# Patient Record
Sex: Male | Born: 1985 | Race: White | Hispanic: Yes | Marital: Married | Smoking: Never smoker
Health system: Southern US, Community
[De-identification: ages and names within clinical notes are randomized; demographics above are authoritative.]

## PROBLEM LIST (undated history)

## (undated) DIAGNOSIS — E785 Hyperlipidemia, unspecified: Secondary | ICD-10-CM

## (undated) DIAGNOSIS — J342 Deviated nasal septum: Secondary | ICD-10-CM

---

## 2018-06-17 ENCOUNTER — Encounter (INDEPENDENT_AMBULATORY_CARE_PROVIDER_SITE_OTHER): Payer: Self-pay | Admitting: Physician Assistant

## 2018-06-17 ENCOUNTER — Other Ambulatory Visit: Payer: Self-pay

## 2018-06-17 ENCOUNTER — Ambulatory Visit (INDEPENDENT_AMBULATORY_CARE_PROVIDER_SITE_OTHER): Payer: 59 | Admitting: Physician Assistant

## 2018-06-17 VITALS — BP 107/74 | HR 83 | Temp 97.8°F | Ht 71.5 in | Wt 238.8 lb

## 2018-06-17 DIAGNOSIS — R079 Chest pain, unspecified: Secondary | ICD-10-CM

## 2018-06-17 DIAGNOSIS — Z114 Encounter for screening for human immunodeficiency virus [HIV]: Secondary | ICD-10-CM

## 2018-06-17 DIAGNOSIS — F411 Generalized anxiety disorder: Secondary | ICD-10-CM | POA: Diagnosis not present

## 2018-06-17 DIAGNOSIS — R109 Unspecified abdominal pain: Secondary | ICD-10-CM

## 2018-06-17 LAB — POCT URINALYSIS DIPSTICK
BILIRUBIN UA: NEGATIVE
GLUCOSE UA: NEGATIVE
Ketones, UA: NEGATIVE
Leukocytes, UA: NEGATIVE
Nitrite, UA: NEGATIVE
Protein, UA: POSITIVE — AB
RBC UA: NEGATIVE
Spec Grav, UA: 1.015 (ref 1.010–1.025)
Urobilinogen, UA: 0.2 E.U./dL
pH, UA: 8.5 — AB (ref 5.0–8.0)

## 2018-06-17 MED ORDER — NAPROXEN 500 MG PO TABS: 500.0000 mg | ORAL_TABLET | Freq: Two times a day (BID) | ORAL | 0 refills | Status: DC

## 2018-06-17 MED ORDER — DULOXETINE HCL 30 MG PO CPEP: 30.0000 mg | ORAL_CAPSULE | Freq: Every day | ORAL | 3 refills | Status: DC

## 2018-06-17 NOTE — Patient Instructions (Signed)
Dolor de pecho inespecífico  (Nonspecific Chest Pain)  El dolor de pecho puede deberse a muchas enfermedades diferentes. Siempre existe una posibilidad de que el dolor esté relacionado con algo grave, como un infarto de miocardio o un coágulo sanguíneo en los pulmones. Hay muchas enfermedades que no son potencialmente mortales que pueden causar dolor de pecho. Si tiene dolor de pecho, es muy importante que se controle con el médico.  CAUSAS  Las causas del dolor de pecho pueden ser las siguientes:  Acidez estomacal.  Neumonía o bronquitis.  Ansiedad o estrés.  Inflamación de la zona que rodea al corazón (pericarditis) o a los pulmones (pleuritis o pleuresía).  Un coágulo sanguíneo en el pulmón.  Colapso de un pulmón (neumotórax), que puede aparecer de manera repentina por sí solo (neumotórax espontáneo) o debido a un traumatismo en el tórax.  Culebrilla (virus de la varicela zóster).  Infarto de miocardio.  Daño de los huesos, los músculos y los cartílagos que conforman la pared torácica. Esto puede incluir lo siguiente:  Hematomas óseos debido a lesiones.  Distensiones musculares o de los cartílagos por tos frecuente o repetida, o por exceso de trabajo.  Fractura de una o más costillas.  Dolor de cartílago debido a inflamación (costocondritis).  FACTORES DE RIESGO  Los factores de riesgo de tener dolor de pecho pueden incluir lo siguiente:  Actividades que incrementan el riesgo de sufrir traumatismos o lesiones en el tórax.  Infecciones o enfermedades respiratorias que causan tos frecuente.  Enfermedades o excesos en las comidas que pueden causar acidez.  Enfermedades cardíacas o antecedentes familiares de enfermedades cardíacas.  Enfermedades o comportamientos de salud que aumentan el riesgo de tener un coágulo sanguíneo.  Haber tenido varicela (varicela zóster).  SIGNOS Y SÍNTOMAS  El dolor de pecho puede provocar las siguientes sensaciones:  Ardor u hormigueo en la superficie o en lo profundo del pecho.  Dolor  opresivo, continuo o constrictivo.  Dolor vago o intenso que empeora al moverse, toser o inhalar profundamente.  Dolor que también se siente en la espalda, el cuello, el hombro o el brazo, o dolor que se irradia a cualquiera de estas zonas.  El dolor de pecho puede aparecer y desaparecer, o bien puede ser constante.  DIAGNÓSTICO  Quizás se necesiten análisis de laboratorio u otros estudios para encontrar la causa del dolor. El médico puede indicarle que se haga una prueba llamada EGC (electrocadiograma) ambulatorio. El electrocardiograma registra los patrones de los latidos cardíacos en el momento en que se realiza el estudio. También pueden hacerle otros estudios, por ejemplo:  Ecocardiograma transtorácico (ETT). Durante el ecocardiograma, se usan ondas sonoras para crear una imagen de todas las estructuras cardíacas y evaluar cómo circula la sangre por el corazón.  Ecocardiograma transesofágico (ETE). Este es un estudio de diagnóstico por imágenes más avanzado que el obtiene imágenes del interior del cuerpo. Le permite al médico ver el corazón con mayor detalle.  Monitoreo cardíaco. Permite que el médico controle la frecuencia y el ritmo cardíaco en tiempo real.  Monitor Holter. Es un dispositivo portátil que registra los latidos del corazón y puede ayudar a diagnosticar las arritmias cardíacas. Le permite al médico registrar la actividad cardíaca durante varios días, si es necesario.  Pruebas de esfuerzo. Estas pueden realizarse durante el ejercicio o mediante la administración de un medicamento que acelera los latidos del corazón.  Análisis de sangre.  Diagnóstico por imágenes.  TRATAMIENTO  El tratamiento depende de la causa del dolor de pecho. El tratamiento puede incluir   lo siguiente:  Medicamentos. Estos pueden incluir lo siguiente:  Inhibidores de la acidez estomacal.  Antiinflamatorios.  Analgésicos para las enfermedades inflamatorias.  Antibióticos, si hay una infección.  Medicamentos para disolver los  coágulos sanguíneos.  Medicamentos para tratar la enfermedad arterial coronaria.  Tratamiento complementario para las enfermedades que no requieren la toma de medicamentos. Esto puede incluir lo siguiente:  Descansar.  Aplicar compresas frías o calientes en las zonas lesionadas.  Limitar las actividades hasta que disminuya el dolor.  INSTRUCCIONES PARA EL CUIDADO EN EL HOGAR  Si le recetaron antibióticos, asegúrese de terminarlos, incluso si comienza a sentirse mejor.  Evite las actividades que le causen dolor de pecho.  No consuma ningún producto que contenga tabaco, lo que incluye cigarrillos, tabaco de mascar o cigarrillos electrónicos. Si necesita ayuda para dejar de fumar, consulte al médico.  No beba alcohol.  Tome los medicamentos solamente como se lo haya indicado el médico.  Concurra a todas las visitas de control como se lo haya indicado el médico. Esto es importante. Esto incluye otros estudios si el dolor de pecho no desaparece.  Si la acidez es la causa del dolor de pecho, tal vez le aconsejen que mantenga la cabeza levantada (elevada) mientras duerme. Esto reduce la probabilidad de que el ácido retroceda del estómago al esófago.  Haga cambios en su estilo de vida como se lo haya indicado el médico. Estos pueden incluir lo siguiente:  Practicar actividad física con regularidad. Pida al médico que le sugiera algunas actividades que sean seguras para usted.  Consumir una dieta cardiosaludable. Un nutricionista matriculado puede ayudarlo a hacer elecciones saludables.  Mantener un peso saludable.  Controlar la diabetes, si es necesario.  Reducir las situaciones de estrés.    SOLICITE ATENCIÓN MÉDICA SI:  El dolor de pecho no desaparece después del tratamiento.  Tiene una erupción cutánea con ampollas en el pecho.  Tiene fiebre.    SOLICITE ATENCIÓN MÉDICA DE INMEDIATO SI:  El dolor en el pecho es más intenso.  La tos empeora, o expectora sangre.  Siente un dolor abdominal intenso.  Siente debilidad  intensa.  Se desmaya.  Tiene escalofríos.  Tiene una molestia repentina e inexplicable en el pecho.  Tiene molestias repentinas e inexplicables en los brazos, la espalda, el cuello o la mandíbula.  Le falta el aire en cualquier momento.  Comienza a sudar de manera repentina o la piel se le humedece.  Siente náuseas o vomita.  Se siente repentinamente mareado o se desmaya.  Siente que el corazón comienza a latir rápidamente o que se saltea latidos.  Estos síntomas pueden representar un problema grave que constituye una emergencia. No espere hasta que los síntomas desaparezcan. Solicite atención médica de inmediato. Comuníquese con el servicio de emergencias de su localidad (911 en los [place]). No conduzca por sus propios medios hasta el hospital.  Esta información no tiene como fin reemplazar el consejo del médico. Asegúrese de hacerle al médico cualquier pregunta que tenga.  Document Released: 10/08/2005 Document Revised: 10/29/2014 Document Reviewed: 04/16/2016  Elsevier Interactive Patient Education © 2017 Elsevier Inc.

## 2018-06-17 NOTE — Progress Notes (Signed)
Subjective:  Patient ID: Stephen Gay, male    DOB: 29-Jul-1986  Age: 32 y.o. MRN: [id number]  CC: right flank pain and chest pain  HPI Stephen Gay is a 32 y.o. male with no sginficant medical history presents as new pt with complaint of right flank pain since one year in a waxing and waning fashion. Feels this pain 1-2 times per week. Not associated with meal consumption. Denies trauma/injury. Does not endorse stool abnormalities, urinary abnormalities, jaundice, rash, LE edema, or f/c/n/v. Said an outside provider could not find anything wrong with him but did not order labs or imaging.    Left sided chest pain x6 months. Described as a pressure and lasts approximately 5 minutes. Has approximately 3-4 episodes per week. Not radiating. Not associated with activities. Says he can work and not feel pain. Pain is usually felt when he is at rest. Chest pain associated with tremors. Does not endorse diaphoresis, weakness, nausea, palpitations, or visual blurring. Pt considers himself an anxious person. Seems to worry constantly about "everything". PHQ9 5 and GAD7 9 today.  ROS Review of Systems  Constitutional: Negative for chills, fever and malaise/fatigue.  Eyes: Negative for blurred vision.  Respiratory: Negative for shortness of breath.   Cardiovascular: Positive for chest pain. Negative for palpitations.  Gastrointestinal: Negative for abdominal pain and nausea.  Genitourinary: Negative for dysuria and hematuria.  Musculoskeletal: Negative for joint pain and myalgias.       Right flank pain  Skin: Negative for rash.  Neurological: Negative for tingling and headaches.  Psychiatric/Behavioral: Negative for depression. The patient is nervous/anxious. The patient does not have insomnia.     Objective:  BP 107/74 (BP Location: Left Arm, Patient Position: Sitting, Cuff Size: Large)   Pulse 83   Temp 97.8 F (36.6 C) (Oral)   Ht 5' 11.5" (1.816 m)   Wt 238 lb 12.8 oz (108.3 kg)   SpO2 97%    BMI 32.84 kg/m   BP/Weight 06/17/2018  Systolic BP 107  Diastolic BP 74  Wt. (Lbs) 238.8  BMI 32.84      Physical Exam  Constitutional: He is oriented to person, place, and time.  Well developed, well nourished, NAD, polite  HENT:  Head: Normocephalic and atraumatic.  Eyes: No scleral icterus.  Neck: Normal range of motion. Neck supple. No thyromegaly present.  Cardiovascular: Normal rate, regular rhythm and normal heart sounds.  No LE edema bilaterally  Pulmonary/Chest: Effort normal and breath sounds normal.  Abdominal: Soft. Bowel sounds are normal. There is no tenderness.  No hepatomegaly  Musculoskeletal: He exhibits no edema.  Neurological: He is alert and oriented to person, place, and time.  Skin: Skin is warm and dry. No rash noted. No erythema. No pallor.  Psychiatric: He has a normal mood and affect. His behavior is normal. Thought content normal.  Vitals reviewed.    Assessment & Plan:    1. Right flank pain - Urinalysis Dipstick with 1+ protein - CBC with Differential - Comprehensive metabolic panel - US Abdomen Limited RUQ; Future - Begin naproxen (NAPROSYN) 500 MG tablet; Take 1 tablet (500 mg total) by mouth 2 (two) times daily with a meal.  Dispense: 30 tablet; Refill: 0  2. Chest pain, unspecified type - EKG 12-Lead NSR - Begin naproxen (NAPROSYN) 500 MG tablet; Take 1 tablet (500 mg total) by mouth 2 (two) times daily with a meal.  Dispense: 30 tablet; Refill: 0  3. Screening for HIV (human immunodeficiency virus) - HIV antibody  4. GAD (generalized anxiety disorder) - Begin DULoxetine (CYMBALTA) 30 MG capsule; Take 1 capsule (30 mg total) by mouth daily.  Dispense: 30 capsule; Refill: 3   Meds ordered this encounter  Medications  . naproxen (NAPROSYN) 500 MG tablet    Sig: Take 1 tablet (500 mg total) by mouth 2 (two) times daily with a meal.    Dispense:  30 tablet    Refill:  0    Order Specific Question:   Supervising Provider     Answer:   Hoy RegisterNEWLIN, ENOBONG [[id number]]  . DULoxetine (CYMBALTA) 30 MG capsule    Sig: Take 1 capsule (30 mg total) by mouth daily.    Dispense:  30 capsule    Refill:  3    Order Specific Question:   Supervising Provider    Answer:   Hoy RegisterNEWLIN, ENOBONG [[id number]]    Follow-up: Return in about 4 weeks (around 07/15/2018) for f/u chest pain and right flank pain.Stephen Gay.   Stephen Torr David Lorene Samaan PA

## 2018-06-18 ENCOUNTER — Telehealth (INDEPENDENT_AMBULATORY_CARE_PROVIDER_SITE_OTHER): Payer: Self-pay

## 2018-06-18 LAB — CBC WITH DIFFERENTIAL/PLATELET
BASOS ABS: 0 10*3/uL (ref 0.0–0.2)
BASOS: 1 %
EOS (ABSOLUTE): 0.3 10*3/uL (ref 0.0–0.4)
Eos: 3 %
Hematocrit: 44.6 % (ref 37.5–51.0)
Hemoglobin: 14.8 g/dL (ref 13.0–17.7)
IMMATURE GRANS (ABS): 0 10*3/uL (ref 0.0–0.1)
IMMATURE GRANULOCYTES: 0 %
LYMPHS: 26 %
Lymphocytes Absolute: 2 10*3/uL (ref 0.7–3.1)
MCH: 28.8 pg (ref 26.6–33.0)
MCHC: 33.2 g/dL (ref 31.5–35.7)
MCV: 87 fL (ref 79–97)
Monocytes Absolute: 0.6 10*3/uL (ref 0.1–0.9)
Monocytes: 8 %
NEUTROS PCT: 62 %
Neutrophils Absolute: 4.9 10*3/uL (ref 1.4–7.0)
PLATELETS: 227 10*3/uL (ref 150–450)
RBC: 5.13 x10E6/uL (ref 4.14–5.80)
RDW: 13.7 % (ref 12.3–15.4)
WBC: 7.9 10*3/uL (ref 3.4–10.8)

## 2018-06-18 LAB — COMPREHENSIVE METABOLIC PANEL
ALK PHOS: 80 IU/L (ref 39–117)
ALT: 26 IU/L (ref 0–44)
AST: 20 IU/L (ref 0–40)
Albumin/Globulin Ratio: 1.9 (ref 1.2–2.2)
Albumin: 4.4 g/dL (ref 3.5–5.5)
BUN/Creatinine Ratio: 10 (ref 9–20)
BUN: 8 mg/dL (ref 6–20)
Bilirubin Total: 0.5 mg/dL (ref 0.0–1.2)
CALCIUM: 9.4 mg/dL (ref 8.7–10.2)
CO2: 27 mmol/L (ref 20–29)
CREATININE: 0.83 mg/dL (ref 0.76–1.27)
Chloride: 101 mmol/L (ref 96–106)
GFR calc Af Amer: 136 mL/min/{1.73_m2} (ref >59)
GFR, EST NON AFRICAN AMERICAN: 117 mL/min/{1.73_m2} (ref >59)
GLUCOSE: 96 mg/dL (ref 65–99)
Globulin, Total: 2.3 g/dL (ref 1.5–4.5)
POTASSIUM: 3.8 mmol/L (ref 3.5–5.2)
Sodium: 141 mmol/L (ref 134–144)
Total Protein: 6.7 g/dL (ref 6.0–8.5)

## 2018-06-18 LAB — HIV ANTIBODY (ROUTINE TESTING W REFLEX): HIV SCREEN 4TH GENERATION: NONREACTIVE

## 2018-06-18 NOTE — Telephone Encounter (Signed)
Patient returned call and was informed that all labs are normal. Stephen Gay S Kween Bacorn, CMA  

## 2018-06-18 NOTE — Telephone Encounter (Signed)
Called patient, voicemail not setup yet. Will attempt to reach patient once more before mailing results. Maryjean Mornempestt S Roberts, CMA

## 2018-06-18 NOTE — Telephone Encounter (Signed)
-----   Message from Roger David Gomez, PA-C sent at 06/18/2018 12:58 PM EDT ----- All labs completely normal. 

## 2018-06-18 NOTE — Telephone Encounter (Signed)
-----   Message from Loletta Specteroger David Gomez, PA-C sent at 06/18/2018 12:58 PM EDT ----- All labs completely normal.

## 2018-06-19 ENCOUNTER — Other Ambulatory Visit (INDEPENDENT_AMBULATORY_CARE_PROVIDER_SITE_OTHER): Payer: Self-pay | Admitting: Physician Assistant

## 2018-06-19 ENCOUNTER — Ambulatory Visit (HOSPITAL_COMMUNITY)
Admission: RE | Admit: 2018-06-19 | Discharge: 2018-06-19 | Disposition: A | Discharge status: Home / Self Care | Payer: 59 | Source: Ambulatory Visit | Attending: Physician Assistant | Admitting: Physician Assistant

## 2018-06-19 DIAGNOSIS — R079 Chest pain, unspecified: Secondary | ICD-10-CM

## 2018-06-19 DIAGNOSIS — R109 Unspecified abdominal pain: Secondary | ICD-10-CM

## 2018-06-19 DIAGNOSIS — K8020 Calculus of gallbladder without cholecystitis without obstruction: Secondary | ICD-10-CM | POA: Insufficient documentation

## 2018-06-20 ENCOUNTER — Telehealth (INDEPENDENT_AMBULATORY_CARE_PROVIDER_SITE_OTHER): Payer: Self-pay

## 2018-06-20 NOTE — Telephone Encounter (Signed)
Call placed to patient but voicemail is not setup yet. Will attempt to contact patient once more before mailing results. Stephen Gay S Eddi Hymes, CMA

## 2018-06-20 NOTE — Telephone Encounter (Signed)
-----   Message from Roger David Gomez, PA-C sent at 06/19/2018  5:32 PM EDT ----- Gallstones seen but no gallbladder inflammation. Pt will have to abstain from eating fatty foods. Surgery for removal of the gallbladder is elective at this point. 

## 2018-06-20 NOTE — Telephone Encounter (Signed)
-----   Message from Loletta Specteroger David Gomez, PA-C sent at 06/19/2018  5:32 PM EDT ----- Gallstones seen but no gallbladder inflammation. Pt will have to abstain from eating fatty foods. Surgery for removal of the gallbladder is elective at this point.

## 2018-06-20 NOTE — Telephone Encounter (Signed)
Patient is aware that US revealed gallstones but no inflammation of the gallbladder surgery to remove gallbladder is elective( not required) at this point. Advised to abstain from eating fatty foods. Patient expressed understanding. Maryjean Mornempestt S Mark Benecke, CMA

## 2018-07-21 ENCOUNTER — Ambulatory Visit (INDEPENDENT_AMBULATORY_CARE_PROVIDER_SITE_OTHER): Payer: 59 | Admitting: Physician Assistant

## 2018-07-21 ENCOUNTER — Encounter (INDEPENDENT_AMBULATORY_CARE_PROVIDER_SITE_OTHER): Payer: Self-pay | Admitting: Physician Assistant

## 2018-07-21 VITALS — BP 101/68 | HR 74 | Temp 98.0°F | Ht 71.5 in | Wt 237.6 lb

## 2018-07-21 DIAGNOSIS — R0602 Shortness of breath: Secondary | ICD-10-CM

## 2018-07-21 DIAGNOSIS — R079 Chest pain, unspecified: Secondary | ICD-10-CM

## 2018-07-21 DIAGNOSIS — F411 Generalized anxiety disorder: Secondary | ICD-10-CM

## 2018-07-21 DIAGNOSIS — K8020 Calculus of gallbladder without cholecystitis without obstruction: Secondary | ICD-10-CM | POA: Diagnosis not present

## 2018-07-21 MED ORDER — DULOXETINE HCL 30 MG PO CPEP: 30.0000 mg | ORAL_CAPSULE | Freq: Every day | ORAL | 3 refills | Status: DC

## 2018-07-21 MED ORDER — OMEPRAZOLE 40 MG PO CPDR: 40.0000 mg | DELAYED_RELEASE_CAPSULE | Freq: Two times a day (BID) | ORAL | 0 refills | Status: DC

## 2018-07-21 NOTE — Progress Notes (Signed)
Subjective:  Patient ID: Stephen Gay, male    DOB: November 29, 1985  Age: 32 y.o. MRN: [id number]  CC: f/u chest pain and flank pain  HPI Stephen Gay is a 32 y.o. male with no sginficant medical history presents to f/u on right flank pain. Onset one year in a waxing and waning fashion. Pt had recent US RUQ that revealed cholelithiasis without cholecystitis. Pt states episodes of RUQ pain when eating food such as chicken wings. Also states he has left sided chest pain. Previous work up one month ago revealed normal EKG, CMP, and CBC. Naproxen did not relieve chest pain. Pt also concerned about have respiratory disease and wants a referral to pulmonologist. Says he works in an [employer] and is exposed to chemicals. Wears a respirator at work but thinks he is now becoming more easily short of breath with normal daily tasks. Does not endorse cough or f/c/n/v. Pt did not take Cymbalta because it was not dispensed to him.       Outpatient Medications Prior to Visit  Medication Sig Dispense Refill  . naproxen (NAPROSYN) 500 MG tablet Take 1 tablet (500 mg total) by mouth 2 (two) times daily with a meal. 30 tablet 0  . DULoxetine (CYMBALTA) 30 MG capsule Take 1 capsule (30 mg total) by mouth daily. (Patient not taking: Reported on 07/21/2018) 30 capsule 3   No facility-administered medications prior to visit.      ROS Review of Systems  Constitutional: Negative for chills, fever and malaise/fatigue.  Eyes: Negative for blurred vision.  Respiratory: Positive for shortness of breath. Negative for cough.   Cardiovascular: Positive for chest pain. Negative for palpitations.  Gastrointestinal: Negative for abdominal pain and nausea.  Genitourinary: Negative for dysuria and hematuria.  Musculoskeletal: Negative for joint pain and myalgias.  Skin: Negative for rash.  Neurological: Negative for tingling and headaches.  Psychiatric/Behavioral: Negative for depression. The patient is not  nervous/anxious.     Objective:  BP 101/68   Pulse 74   Temp 98 F (36.7 C) (Oral)   Ht 5' 11.5" (1.816 m)   Wt 237 lb 9.6 oz (107.8 kg)   SpO2 98%   BMI 32.68 kg/m   BP/Weight 07/21/2018 06/17/2018  Systolic BP 101 107  Diastolic BP 68 74  Wt. (Lbs) 237.6 238.8  BMI 32.68 32.84      Physical Exam  Constitutional: He is oriented to person, place, and time.  Well developed, well nourished, NAD, polite  HENT:  Head: Normocephalic and atraumatic.  Eyes: No scleral icterus.  Neck: Normal range of motion. Neck supple. No thyromegaly present.  Cardiovascular: Normal rate, regular rhythm and normal heart sounds.  Pulmonary/Chest: Effort normal and breath sounds normal.  Musculoskeletal: He exhibits no edema.  Neurological: He is alert and oriented to person, place, and time.  Skin: Skin is warm and dry. No rash noted. No erythema. No pallor.  Psychiatric: He has a normal mood and affect. His behavior is normal. Thought content normal.  Vitals reviewed.    Assessment & Plan:   1. Chest pain, unspecified type - Lipid panel - H. pylori antibody, IgG  2. Cholelithiasis without cholecystitis - Pt advised about diet and elective surgery.   3. GAD (generalized anxiety disorder) - DULoxetine (CYMBALTA) 30 MG capsule; Take 1 capsule (30 mg total) by mouth daily.  Dispense: 30 capsule; Refill: 3  4. Shortness of breath - Ambulatory referral to Pulmonology   Meds ordered this encounter  Medications  . omeprazole (  PRILOSEC) 40 MG capsule    Sig: Take 1 capsule (40 mg total) by mouth 2 (two) times daily.    Dispense:  60 capsule    Refill:  0    Order Specific Question:   Supervising Provider    Answer:   Hoy Register [[id number]]  . DULoxetine (CYMBALTA) 30 MG capsule    Sig: Take 1 capsule (30 mg total) by mouth daily.    Dispense:  30 capsule    Refill:  3    Order Specific Question:   Supervising Provider    Answer:   Hoy Register [[id number]]    Follow-up: Return in  about 6 weeks (around 09/01/2018) for Chest pain.   Loletta Specter PA

## 2018-07-21 NOTE — Patient Instructions (Signed)
Acidez estomacal  (Heartburn)  La acidez estomacal es un tipo de dolor o de molestia que se puede presentar en la garganta o en el pecho. A menudo se la describe como una quemazn. Tambin puede producir mal aliento. La sensacin de acidez puede empeorar al acostarse o inclinarse. Puede deberse al retroceso (reflujo) de los contenidos estomacales hacia el tubo que conecta la boca con el estmago (esfago).  CUIDADOS EN EL HOGAR  Tome estas medidas para aliviar las molestias y evitar los problemas.  Dieta   Siga la dieta como se lo haya indicado el mdico. Tal vez deba evitar los siguientes alimentos y bebidas:  ? Caf y t (con o sin cafena).  ? Bebidas que contengan alcohol.  ? Bebidas energizantes y deportivas.  ? Gaseosas o refrescos.  ? Chocolate y cacao.  ? Menta y esencias de menta.  ? Ajo y cebollas.  ? Rbano picante.  ? Alimentos muy condimentados y cidos, como pimientos, chile en polvo, curry en polvo, vinagre, salsas picantes y salsa barbacoa.  ? Frutas ctricas y sus jugos, como naranjas, limones y limas.  ? Alimentos a base de tomates, como salsa roja, chile, salsa y pizza con salsa roja.  ? Alimentos fritos y grasos, como rosquillas, papas fritas y aderezos con alto contenido de grasa.  ? Carnes con alto contenido de grasa, como hot dogs, filetes de entrecot, salchicha, jamn y tocino.  ? Productos lcteos con alto contenido de grasa, como leche entera, mantequilla y queso crema.   Consuma pequeas porciones de comida con ms frecuencia. Evite consumir porciones abundantes.   Evite beber mucho lquido con las comidas.   No coma durante las 2 o 3horas previas a la hora de acostarse.   No se acueste inmediatamente despus de comer.   No haga actividad fsica enseguida despus de comer.  Instrucciones generales   Est atento a cualquier cambio en los sntomas.   Tome los medicamentos de venta libre y los recetados solamente como se lo haya indicado el mdico. No tome aspirina, ibuprofeno ni  otros antiinflamatorios no esteroides (AINE), a menos que el mdico lo autorice.   No consuma ningn producto que contenga tabaco, lo que incluye cigarrillos, tabaco de mascar y cigarrillos electrnicos. Si necesita ayuda para dejar de fumar, consulte al mdico.   Use ropa suelta. No use nada ajustado alrededor de la cintura.   Levante (eleve) unas 6pulgadas (15centmetros) la cabecera de la cama.   Intente bajar el nivel de estrs. Si necesita ayuda para hacerlo, consulte al mdico.   Si tiene sobrepeso, adelgace hasta alcanzar un peso saludable. Pregntele a su mdico cmo puede perder peso de manera segura.   Concurra a todas las visitas de control como se lo haya indicado el mdico. Esto es importante.  SOLICITE AYUDA SI:   Aparecen nuevos sntomas.   Baja de peso y no sabe por qu.   Tiene dificultad para tragar o siente dolor al hacerlo.   Tiene sibilancias o tos que no desaparece.   Los sntomas no mejoran con el tratamiento.   Tiene acidez frecuentemente durante ms de dos semanas.  SOLICITE AYUDA DE INMEDIATO SI:   Tiene dolor en los brazos, el cuello, los maxilares, la dentadura o la espalda.   Transpira, se marea o tiene sensacin de desvanecimiento.   Siente falta de aire o dolor en el pecho.   Vomita y el vmito es parecido a la sangre o a los granos de caf.   Las heces son sanguinolentas   o de color negro.  Esta informacin no tiene como fin reemplazar el consejo del mdico. Asegrese de hacerle al mdico cualquier pregunta que tenga.  Document Released: 06/20/2011 Document Revised: 06/29/2015 Document Reviewed: 02/02/2015  Elsevier Interactive Patient Education  2018 Elsevier Inc.

## 2018-07-22 ENCOUNTER — Other Ambulatory Visit (INDEPENDENT_AMBULATORY_CARE_PROVIDER_SITE_OTHER): Payer: Self-pay | Admitting: Physician Assistant

## 2018-07-22 DIAGNOSIS — A048 Other specified bacterial intestinal infections: Secondary | ICD-10-CM

## 2018-07-22 DIAGNOSIS — E7841 Elevated Lipoprotein(a): Secondary | ICD-10-CM

## 2018-07-22 LAB — LIPID PANEL
Chol/HDL Ratio: 5.3 ratio — ABNORMAL HIGH (ref 0.0–5.0)
Cholesterol, Total: 223 mg/dL — ABNORMAL HIGH (ref 100–199)
HDL: 42 mg/dL (ref >39)
LDL Calculated: 156 mg/dL — ABNORMAL HIGH (ref 0–99)
TRIGLYCERIDES: 125 mg/dL (ref 0–149)
VLDL Cholesterol Cal: 25 mg/dL (ref 5–40)

## 2018-07-22 LAB — H. PYLORI ANTIBODY, IGG: H PYLORI IGG: 8.17 {index_val} — AB (ref 0.00–0.79)

## 2018-07-22 MED ORDER — AMOXICILLIN 500 MG PO TABS: 1000.0000 mg | ORAL_TABLET | Freq: Two times a day (BID) | ORAL | 0 refills | Status: DC

## 2018-07-22 MED ORDER — CLARITHROMYCIN 500 MG PO TABS: 500.0000 mg | ORAL_TABLET | Freq: Two times a day (BID) | ORAL | 0 refills | Status: DC

## 2018-07-25 ENCOUNTER — Telehealth (INDEPENDENT_AMBULATORY_CARE_PROVIDER_SITE_OTHER): Payer: Self-pay

## 2018-07-25 NOTE — Telephone Encounter (Signed)
Call placed using pacific interpreter Lurena Joiner [phone]) patient is aware of H pylori infection and antibiotic being sent to [facility] on [address]. Cholesterol elevated, atorvastatin will be sent once he has completed antibiotics. Patient expressed understanding and did not have any additional questions. Maryjean Morn, CMA

## 2018-07-25 NOTE — Telephone Encounter (Signed)
-----   Message from Loletta Specter, PA-C sent at 07/22/2018  6:10 PM EDT ----- H pylori infection. Cholesterol elevated. I have sent antibiotics to [facility] on [address]. I will send atorvastatin to his pharmacy after he finishes his antibiotics.

## 2018-08-05 ENCOUNTER — Other Ambulatory Visit (INDEPENDENT_AMBULATORY_CARE_PROVIDER_SITE_OTHER): Payer: 59

## 2018-08-05 ENCOUNTER — Encounter: Payer: Self-pay | Admitting: Internal Medicine

## 2018-08-05 ENCOUNTER — Ambulatory Visit (INDEPENDENT_AMBULATORY_CARE_PROVIDER_SITE_OTHER)
Admission: RE | Admit: 2018-08-05 | Discharge: 2018-08-05 | Disposition: A | Discharge status: Home / Self Care | Payer: 59 | Source: Ambulatory Visit | Attending: Internal Medicine | Admitting: Internal Medicine

## 2018-08-05 ENCOUNTER — Ambulatory Visit: Payer: 59 | Admitting: Internal Medicine

## 2018-08-05 VITALS — BP 110/90 | HR 91 | Ht 71.0 in | Wt 232.0 lb

## 2018-08-05 DIAGNOSIS — R0609 Other forms of dyspnea: Secondary | ICD-10-CM

## 2018-08-05 DIAGNOSIS — R0600 Dyspnea, unspecified: Secondary | ICD-10-CM

## 2018-08-05 DIAGNOSIS — R0789 Other chest pain: Secondary | ICD-10-CM

## 2018-08-05 DIAGNOSIS — R0602 Shortness of breath: Secondary | ICD-10-CM | POA: Diagnosis not present

## 2018-08-05 LAB — CBC WITH DIFFERENTIAL/PLATELET
Basophils Absolute: 0 10*3/uL (ref 0.0–0.1)
Basophils Relative: 0.7 % (ref 0.0–3.0)
Eosinophils Absolute: 0.2 10*3/uL (ref 0.0–0.7)
Eosinophils Relative: 4 % (ref 0.0–5.0)
HCT: 45.1 % (ref 39.0–52.0)
Hemoglobin: 15.1 g/dL (ref 13.0–17.0)
LYMPHS ABS: 2.2 10*3/uL (ref 0.7–4.0)
Lymphocytes Relative: 37.5 % (ref 12.0–46.0)
MCHC: 33.4 g/dL (ref 30.0–36.0)
MCV: 85.4 fl (ref 78.0–100.0)
MONOS PCT: 6.9 % (ref 3.0–12.0)
Monocytes Absolute: 0.4 10*3/uL (ref 0.1–1.0)
NEUTROS ABS: 3.1 10*3/uL (ref 1.4–7.7)
NEUTROS PCT: 50.9 % (ref 43.0–77.0)
PLATELETS: 229 10*3/uL (ref 150.0–400.0)
RBC: 5.29 Mil/uL (ref 4.22–5.81)
RDW: 13.1 % (ref 11.5–15.5)
WBC: 6 10*3/uL (ref 4.0–10.5)

## 2018-08-05 LAB — URINALYSIS
Bilirubin Urine: NEGATIVE
Hgb urine dipstick: NEGATIVE
Ketones, ur: NEGATIVE
Leukocytes, UA: NEGATIVE
Nitrite: NEGATIVE
PH: 6 (ref 5.0–8.0)
SPECIFIC GRAVITY, URINE: 1.025 (ref 1.000–1.030)
TOTAL PROTEIN, URINE-UPE24: NEGATIVE
URINE GLUCOSE: NEGATIVE
Urobilinogen, UA: 0.2 (ref 0.0–1.0)

## 2018-08-05 LAB — BASIC METABOLIC PANEL
BUN: 13 mg/dL (ref 6–23)
CALCIUM: 9.6 mg/dL (ref 8.4–10.5)
CO2: 31 mEq/L (ref 19–32)
CREATININE: 0.86 mg/dL (ref 0.40–1.50)
Chloride: 104 mEq/L (ref 96–112)
GFR: 109.92 mL/min (ref >60.00)
Glucose, Bld: 99 mg/dL (ref 70–99)
Potassium: 4 mEq/L (ref 3.5–5.1)
Sodium: 139 mEq/L (ref 135–145)

## 2018-08-05 LAB — SEDIMENTATION RATE: Sed Rate: 2 mm/hr (ref 0–15)

## 2018-08-05 LAB — HEPATIC FUNCTION PANEL
ALBUMIN: 4.7 g/dL (ref 3.5–5.2)
ALT: 15 U/L (ref 0–53)
AST: 15 U/L (ref 0–37)
Alkaline Phosphatase: 68 U/L (ref 39–117)
Bilirubin, Direct: 0.1 mg/dL (ref 0.0–0.3)
TOTAL PROTEIN: 7.8 g/dL (ref 6.0–8.3)
Total Bilirubin: 0.5 mg/dL (ref 0.2–1.2)

## 2018-08-05 LAB — BRAIN NATRIURETIC PEPTIDE: Pro B Natriuretic peptide (BNP): 10 pg/mL (ref 0.0–100.0)

## 2018-08-05 NOTE — Progress Notes (Signed)
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      History of Present Illness  08/05/2018  Pulmonary/ 1st office eval  Chief Complaint  Patient presents with  . Pulmonary Consult    Referred by [name]. Pt c/o SOB x 2 wks. He gets SOB "when I get tired".   first thing noted was L upper ant chest and R flank both at same time around 01/2018 ten   bilateral lower chest /upper back pain x 4 weeks  lasting up 5 min, always same places/ never any two at same time/  not positional /just as likely to occur supine as bending/ twisting / turning/ before or after eating   And exertion doesn't appear to provoke/ just as bad when absent from work but concerned about paint fumes he's breathing in there although wears respirator most of the time / no better with ppi  Dyspnea:  Not limited by breathing from desired activities   Cough: none Sleep: flat/ one pillow  SABA use: none    No obvious day to day or daytime variability or assoc excess/ purulent sputum or mucus plugs or hemoptysis or  chest tightness, subjective wheeze or overt sinus or hb symptoms.   Sleeping  without nocturnal  or early am exacerbation  of respiratory  c/o's or need for noct saba. Also denies any obvious fluctuation of symptoms with weather or environmental changes or other aggravating or alleviating factors except as outlined above   No unusual exposure hx or h/o childhood pna/ asthma or knowledge of premature birth.  Current Allergies, Complete Past  Medical History, Past Surgical History, Family History, and Social History were reviewed in [facility] record.  ROS  The following are not active complaints unless bolded Hoarseness, sore throat, dysphagia, dental problems, itching, sneezing,  nasal congestion or discharge of excess mucus or purulent secretions, ear ache,   fever, chills, sweats, unintended wt loss or wt gain, classically pleuritic or exertional cp,  orthopnea pnd or arm/hand swelling  or leg swelling, presyncope, palpitations, abdominal pain, anorexia, nausea, vomiting, diarrhea  or change in bowel habits or change in bladder habits, change in stools or change in urine, dysuria, hematuria,  rash, arthralgias, visual complaints, headache, numbness, weakness or ataxia or problems with walking or coordination,  change in mood or  memory.            No past medical history on file.  Outpatient Medications Prior to Visit  Medication Sig Dispense Refill  . DULoxetine (CYMBALTA) 30 MG capsule Take 1 capsule (30 mg total) by mouth daily. 30 capsule 3  . omeprazole (PRILOSEC) 40 MG capsule Take 1 capsule (40 mg total) by mouth 2 (two) times daily. 60 capsule 0  . amoxicillin (AMOXIL) 500 MG tablet Take 2 tablets (1,000 mg total) by mouth 2 (two) times daily for 14 days. 56 tablet 0  . clarithromycin (BIAXIN) 500 MG tablet Take 1 tablet (500 mg total) by  mouth 2 (two) times daily for 14 days. 28 tablet 0              Objective:     BP 110/90 (BP Location: Left Arm, Cuff Size: Normal)   Pulse 91   Ht 5\' 11"  (1.803 m)   Wt 232 lb (105.2 kg)   SpO2 96%   BMI 32.36 kg/m   SpO2: 96 %  RA  HEENT: nl dentition, turbinates bilaterally, and oropharynx. Nl external ear canals without cough reflex   NECK :  without JVD/Nodes/TM/ nl carotid upstrokes bilaterally   LUNGS: no acc muscle use,  Nl contour chest which is clear to A and P bilaterally without cough on insp or exp maneuvers   CV:  RRR  no s3  or murmur or increase in P2, and no edema   ABD:  soft and nontender with nl inspiratory excursion in the supine position. No bruits or organomegaly appreciated, bowel sounds nl  MS:  Nl gait/ ext warm without deformities, calf tenderness, cyanosis or clubbing No obvious joint restrictions   SKIN: warm and dry without lesions    NEURO:  alert, approp, nl sensorium with  no motor or cerebellar deficits apparent.      CXR PA and Lateral:   08/05/2018 :    I personally reviewed images and agree with radiology impression as follows:    No active cardiopulmonary disease.    I personally reviewed radiology impression as follows:  [place] abd:  06/19/18 Cholelithiasis without evidence of cholecystitis. No other abnormality seen in the right upper quadrant of the abdomen.   Labs ordered/ reviewed:      Chemistry      Component Value Date/Time   NA 139 08/05/2018 1229   NA 141 06/17/2018 1024   K 4.0 08/05/2018 1229   CL 104 08/05/2018 1229   CO2 31 08/05/2018 1229   BUN 13 08/05/2018 1229   BUN 8 06/17/2018 1024   CREATININE 0.86 08/05/2018 1229      Component Value Date/Time   CALCIUM 9.6 08/05/2018 1229   ALKPHOS 68 08/05/2018 1229   AST 15 08/05/2018 1229   ALT 15 08/05/2018 1229   BILITOT 0.5 08/05/2018 1229   BILITOT 0.5 06/17/2018 1024        Lab Results  Component Value Date   WBC 6.0 08/05/2018   HGB 15.1 08/05/2018   HCT 45.1 08/05/2018   MCV 85.4 08/05/2018   PLT 229.0 08/05/2018        EOS                                                              0.2                                    08/05/2018   Lab Results  Component Value Date   DDIMER 0.37 08/05/2018         Lab Results  Component Value Date   PROBNP 10.0 08/05/2018       Lab Results  Component Value Date   ESRSEDRATE 2 08/05/2018      U/a 08/05/2018 also neg     Assessment   Dyspnea on exertion 08/05/2018  Walked  RA x 3 laps @ 185 ft each stopped due to  End of study, fast   pace, no sob or desat      Symptoms are markedly disproportionate to objective findings and not clear to what extent this is actually a pulmonary  problem but pt does appear to have difficult to sort out respiratory symptoms of unknown origin for which  DDX  = almost all start with A and  include Adherence, Ace Inhibitors, Acid Reflux, Active Sinus Disease, Alpha 1 Antitripsin deficiency, Anxiety masquerading as Airways dz,  ABPA,  Allergy(esp in young), Aspiration (esp in elderly), Adverse effects of meds,  Active smoking or Vaping, A bunch of PE's/clot burden (a few small clots can't cause this syndrome unless there is already severe underlying pulm or vascular dz with poor reserve),  Anemia or thyroid disorder, plus two Bs  = Bronchiectasis and Beta blocker use..and one C= CHF     Adherence is always the initial "prime suspect" and is a multilayered concern that requires a "trust but verify" approach in every patient - starting with knowing how to use medications, especially inhalers, correctly, keeping up with refills and understanding the fundamental difference between maintenance and prns vs those medications only taken for a very short course and then stopped and not refilled.   - rec return with all meds in hand using a trust but verify approach to confirm accurate Medication  Reconciliation The principal here is that until we are certain that the  patients are doing what we've asked, it makes no sense to ask them to do more.    ? Anxiety > usually at the bottom of this list of usual suspects but should be at top based pt's based on H and P and note and  may interfere with adherence and also interpretation of response or lack thereof to symptom management which can be quite subjective.  There is simply is no condition making one sob at rest for a few breaths but never with exertion nor noct other than panic disorder breathing but this still remains dx of exclusion  ? Allergy/ asthma > no assoc  rhintis  ? Active smoking/ vaping > denies   ? Adverse effects of meds > none listed but clearly not responding to ppi > d/c   ? Anemia/ thyroid > ruled out anemia, needs TSH on return to complete the w/u as left off list inadvertently   ? A bunch of PE's > D dimer nl - while a normal  or high normal value (seen commonly in the elderly or chronically ill)  may miss small peripheral pe, the clot burden with sob is moderately high and the d dimer  has a very high neg pred value if used in this setting.     ? 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  Treatment consists of avoiding foods that cause gas (especially boiled eggs, mexcican food but especially  beans and undercooked vegetables like  spinach and some salads)  and citrucel 1 heaping tsp twice daily with a large glass of water.  Pain should improve w/in 2 weeks and if not then consider further GI work up.      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   Total time devoted to counseling  > 50 % of initial 60 min office visit:  review case with pt with interpreter making sure he understood/  discussion of options/alternatives/ personally creating written customized instructions  in presence of pt  then going over those specific  Instructions directly with the pt including how to use all of the meds but in  particular covering each new medication in detail and the difference between the maintenance= "automatic" meds and the prns using an action plan format for the latter (If this problem/symptom => do that organization reading Left to right).  Please see AVS from this visit for a full list of these instructions which I personally wrote for this pt and  are unique to this visit.      Sandrea Hughs, MD 08/05/2018

## 2018-08-05 NOTE — Patient Instructions (Addendum)
Classic subdiaphragmatic pain pattern suggests ibs:  Stereotypical, migratory with a very limited distribution of pain locations, daytime, not usually exacerbated by exercise  or coughing, worse in sitting position, frequently associated with generalized abd bloating, not as likely to be present supine due to the dome effect of the diaphragm which  is  canceled in that position. Frequently these patients have had multiple negative GI workups and CT scans.  Treatment consists of avoiding foods that cause gas (especially boiled eggs, mexcican food but especially  beans and undercooked vegetables like  spinach and some salads)  and citrucel 1 heaping tsp twice daily with a large glass of water until return .  Stop prilosec    Please remember to go to the lab and x-ray department downstairs in the basement  for your tests - we will call you with the results when they are available.     Please schedule a follow up office visit in 4 weeks, sooner if needed with full pfts on retunr

## 2018-08-06 ENCOUNTER — Encounter: Payer: Self-pay | Admitting: Internal Medicine

## 2018-08-06 DIAGNOSIS — R0789 Other chest pain: Secondary | ICD-10-CM | POA: Insufficient documentation

## 2018-08-06 LAB — D-DIMER, QUANTITATIVE (NOT AT ARMC): D DIMER QUANT: 0.37 ug{FEU}/mL (ref <0.50)

## 2018-08-06 NOTE — Assessment & Plan Note (Signed)
08/05/2018  Walked RA x 3 laps @ 185 ft each stopped due to  End of study, fast  pace, no sob or desat      Symptoms are markedly disproportionate to objective findings and not clear to what extent this is actually a pulmonary  problem but pt does appear to have difficult to sort out respiratory symptoms of unknown origin for which  DDX  = almost all start with A and  include Adherence, Ace Inhibitors, Acid Reflux, Active Sinus Disease, Alpha 1 Antitripsin deficiency, Anxiety masquerading as Airways dz,  ABPA,  Allergy(esp in young), Aspiration (esp in elderly), Adverse effects of meds,  Active smoking or Vaping, A bunch of PE's/clot burden (a few small clots can't cause this syndrome unless there is already severe underlying pulm or vascular dz with poor reserve),  Anemia or thyroid disorder, plus two Bs  = Bronchiectasis and Beta blocker use..and one C= CHF     Adherence is always the initial "prime suspect" and is a multilayered concern that requires a "trust but verify" approach in every patient - starting with knowing how to use medications, especially inhalers, correctly, keeping up with refills and understanding the fundamental difference between maintenance and prns vs those medications only taken for a very short course and then stopped and not refilled.   - rec return with all meds in hand using a trust but verify approach to confirm accurate Medication  Reconciliation The principal here is that until we are certain that the  patients are doing what we've asked, it makes no sense to ask them to do more.    ? Anxiety > usually at the bottom of this list of usual suspects but should be at top based pt's based on H and P and note and  may interfere with adherence and also interpretation of response or lack thereof to symptom management which can be quite subjective.  There is simply is no condition making one sob at rest for a few breaths but never with exertion nor noct other than panic disorder  breathing but this still remains dx of exclusion  ? Allergy/ asthma > no assoc rhintis  ? Active smoking/ vaping > denies   ? Adverse effects of meds > none listed but clearly not responding to ppi > d/c   ? Anemia/ thyroid > ruled out anemia, needs TSH on return to complete the w/u as left off list inadvertently   ? A bunch of PE's > D dimer nl - while a normal  or high normal value (seen commonly in the elderly or chronically ill)  may miss small peripheral pe, the clot burden with sob is moderately high and the d dimer  has a very high neg pred value if used in this setting.     ? CHF > excluded by very  low bnp

## 2018-08-06 NOTE — Progress Notes (Signed)
Spoke with pt and notified of results per Dr. Wert. Pt verbalized understanding and denied any questions. 

## 2018-08-06 NOTE — Assessment & Plan Note (Signed)
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  Treatment consists of avoiding foods that cause gas (especially boiled eggs, mexcican food but especially  beans and undercooked vegetables like  spinach and some salads)  and citrucel 1 heaping tsp twice daily with a large glass of water.  Pain should improve w/in 2 weeks and if not then consider further GI work up.      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   Total time devoted to counseling  > 50 % of initial 60 min office visit:  review case with pt with interpreter making sure he understood/  discussion of options/alternatives/ personally creating written customized instructions  in presence of pt  then going over those specific  Instructions directly with the pt including how to use all of the meds but in particular covering each new medication in detail and the difference between the maintenance= "automatic" meds and the prns using an action plan format for the latter (If this problem/symptom => do that organization reading Left to right).  Please see AVS from this visit for a full list of these instructions which I personally wrote for this pt and  are unique to this visit.

## 2018-08-25 ENCOUNTER — Institutional Professional Consult (permissible substitution): Payer: 59 | Admitting: Internal Medicine

## 2018-09-01 ENCOUNTER — Ambulatory Visit (INDEPENDENT_AMBULATORY_CARE_PROVIDER_SITE_OTHER): Payer: 59 | Admitting: Physician Assistant

## 2018-09-29 ENCOUNTER — Ambulatory Visit: Payer: 59 | Admitting: Internal Medicine

## 2018-09-29 ENCOUNTER — Encounter: Payer: Self-pay | Admitting: Internal Medicine

## 2018-09-29 ENCOUNTER — Encounter: Payer: Self-pay | Admitting: *Deleted

## 2018-09-29 ENCOUNTER — Ambulatory Visit (INDEPENDENT_AMBULATORY_CARE_PROVIDER_SITE_OTHER): Payer: 59 | Admitting: Internal Medicine

## 2018-09-29 VITALS — BP 110/70 | HR 83 | Ht 71.5 in | Wt 237.0 lb

## 2018-09-29 DIAGNOSIS — R0609 Other forms of dyspnea: Secondary | ICD-10-CM | POA: Diagnosis not present

## 2018-09-29 DIAGNOSIS — R0600 Dyspnea, unspecified: Secondary | ICD-10-CM

## 2018-09-29 DIAGNOSIS — R0789 Other chest pain: Secondary | ICD-10-CM

## 2018-09-29 LAB — PULMONARY FUNCTION TEST
DL/VA % pred: 113 %
DL/VA: 5.39 ml/min/mmHg/L
DLCO COR % PRED: 108 %
DLCO UNC: 37.64 ml/min/mmHg
DLCO cor: 37.12 ml/min/mmHg
DLCO unc % pred: 109 %
FEF 25-75 POST: 5.75 L/s
FEF 25-75 PRE: 4.72 L/s
FEF2575-%CHANGE-POST: 21 %
FEF2575-%PRED-PRE: 103 %
FEF2575-%Pred-Post: 125 %
FEV1-%Change-Post: 6 %
FEV1-%PRED-POST: 101 %
FEV1-%Pred-Pre: 95 %
FEV1-POST: 4.72 L
FEV1-Pre: 4.42 L
FEV1FVC-%CHANGE-POST: 4 %
FEV1FVC-%PRED-PRE: 100 %
FEV6-%CHANGE-POST: 2 %
FEV6-%Pred-Post: 98 %
FEV6-%Pred-Pre: 95 %
FEV6-Post: 5.54 L
FEV6-Pre: 5.4 L
FEV6FVC-%Pred-Post: 101 %
FEV6FVC-%Pred-Pre: 101 %
FVC-%CHANGE-POST: 2 %
FVC-%PRED-POST: 96 %
FVC-%Pred-Pre: 94 %
FVC-Post: 5.54 L
FVC-Pre: 5.4 L
POST FEV1/FVC RATIO: 85 %
PRE FEV1/FVC RATIO: 82 %
Post FEV6/FVC ratio: 100 %
Pre FEV6/FVC Ratio: 100 %

## 2018-09-29 NOTE — Assessment & Plan Note (Signed)
08/05/2018  Walked RA x 3 laps @ 185 ft each stopped due to  End of study, fast  pace, no sob or desat    - PFT's  09/29/2018  wnl x for erv 50%   No evidence of a cardiac or pulmonary mechanism for sob other than reduction of erv c/w body habitus so strongly rec re-conditioning exercises then cpst if not improving to his satisfaction by the first of 2020  see avs for instructions unique to this ov

## 2018-09-29 NOTE — Progress Notes (Signed)
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      History of Present Illness  08/05/2018  Pulmonary/ 1st office eval  Chief Complaint  Patient presents with  . Pulmonary Consult    Referred by [name]. Pt c/o SOB x 2 wks. He gets SOB "when I get tired".   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 Frequently these patients have had multiple negative GI workups and CT scans. Treatment consists of avoiding foods that cause gas (especially boiled eggs, mexcican food but  especially  beans and undercooked vegetables like  spinach and some salads)  and citrucel 1 heaping tsp twice daily with a large glass of water until return . Stop prilosec    09/29/2018  f/u ov/Jarmaine Ehrler re: sob ? Etiology  - migratory cp resolved with IBS rx Chief Complaint  Patient presents with  . Follow-up    Breathing is unchanged since last OV.   Dyspnea:  Not limited by breathing from desired activities  But very sedentary  Cough: none Sleeping: fine SABA use: none  02: none   No obvious day to day or daytime variability or assoc excess/ purulent sputum or mucus plugs or hemoptysis or cp or chest tightness, subjective wheeze or overt sinus or hb symptoms.   Sleeping flat without nocturnal  or early am exacerbation  of respiratory  c/o's or need for noct saba. Also denies any obvious fluctuation of symptoms with weather or environmental changes or other aggravating or alleviating factors except as outlined above   No unusual exposure hx or h/o childhood pna/ asthma or knowledge of premature birth.  Current Allergies, Complete Past Medical History, Past Surgical History, Family History, and Social History were reviewed in [facility] record.  ROS  The following are not active complaints unless bolded Hoarseness, sore throat, dysphagia, dental problems, itching, sneezing,  nasal congestion or discharge of excess mucus or purulent secretions, ear ache,   fever,  chills, sweats, unintended wt loss or wt gain, classically pleuritic or exertional cp,  orthopnea pnd or arm/hand swelling  or leg swelling, presyncope, palpitations, abdominal pain, anorexia, nausea, vomiting, diarrhea  or change in bowel habits or change in bladder habits, change in stools or change in urine, dysuria, hematuria,  rash, arthralgias, visual complaints, headache, numbness, weakness or ataxia or problems with walking or coordination,  change in mood or  memory.              Objective:      amb latino male   Wt Readings from Last 3 Encounters:  09/29/18 237 lb (107.5 kg)  08/05/18 232 lb (105.2 kg)  07/21/18 237 lb 9.6 oz (107.8 kg)     Vital signs reviewed - Note on arrival 02 sats  100% on RA   HEENT: nl dentition, turbinates bilaterally, and oropharynx. Nl external ear canals without cough reflex   NECK :  without JVD/Nodes/TM/ nl carotid upstrokes bilaterally   LUNGS: no acc muscle use,  Nl contour chest which is clear to A and P bilaterally without cough on insp or exp maneuvers   CV:  RRR  no s3 or murmur or increase in P2, and no edema   ABD:  Mod obese/  soft and nontender with nl inspiratory excursion in the supine position. No bruits or organomegaly appreciated, bowel sounds nl  MS:  Nl gait/ ext warm without deformities, calf tenderness, cyanosis or clubbing No obvious joint restrictions   SKIN: warm and dry without lesions    NEURO:  alert, approp, nl sensorium with  no motor or cerebellar deficits apparent.                    I personally reviewed radiology impression as follows:  [place] abd:  06/19/18 Cholelithiasis without evidence of cholecystitis. No other abnormality seen in the right upper quadrant of the abdomen.               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  Treatment consists of avoiding foods that cause gas (especially boiled eggs, mexcican food but especially   beans and undercooked vegetables like  spinach and some salads)  and citrucel 1 heaping tsp twice daily with a large glass of water.  Pain should improve w/in 2 weeks and if not then consider further GI work up.      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

## 2018-09-29 NOTE — Assessment & Plan Note (Signed)
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 > resolved by f/u ov 09/29/2018   rec continue diet/ citrucel and f/u gi surgery as planned as he does have cholelithiasis with symptoms that can overlap with IBS   I had an extended  Summary final discussion with the patient reviewing all relevant studies completed to date and  lasting 15 to 20 minutes of a 25 minute visit    Each maintenance medication was reviewed in detail including most importantly the difference between maintenance and prns and under what circumstances the prns are to be triggered using an action plan format that is not reflected in the computer generated alphabetically organized AVS.     Please see AVS for specific instructions unique to this visit that I personally wrote and verbalized to the the pt in detail and then reviewed with pt  by my nurse highlighting any  changes in therapy recommended at today's visit to their plan of care.

## 2018-09-29 NOTE — Patient Instructions (Addendum)
To get the most out of exercise, you need to be continuously aware that you are short of breath, but never out of breath, for 30 minutes daily. As you improve, it will actually be easier for you to do the same amount of exercise  in  30 minutes so always push to the level where you are short of breath.       If not improving after the holidays please call me to schedule a CPST at our office on [address] [facility])    If you are satisfied with your treatment plan,  let your doctor know and he/she can either refill your medications or you can return here when your prescription runs out.     If in any way you are not 100% satisfied,  please tell us.  If 100% better, tell your friends!  Pulmonary follow up is as needed

## 2018-09-29 NOTE — Progress Notes (Signed)
PFT completed today.  

## 2018-10-28 ENCOUNTER — Other Ambulatory Visit: Payer: Self-pay

## 2018-10-28 ENCOUNTER — Ambulatory Visit (INDEPENDENT_AMBULATORY_CARE_PROVIDER_SITE_OTHER): Payer: 59 | Admitting: Physician Assistant

## 2018-10-28 ENCOUNTER — Encounter (INDEPENDENT_AMBULATORY_CARE_PROVIDER_SITE_OTHER): Payer: Self-pay | Admitting: Physician Assistant

## 2018-10-28 VITALS — BP 117/75 | HR 107 | Temp 97.9°F | Ht 71.5 in | Wt 234.0 lb

## 2018-10-28 DIAGNOSIS — A048 Other specified bacterial intestinal infections: Secondary | ICD-10-CM

## 2018-10-28 DIAGNOSIS — F411 Generalized anxiety disorder: Secondary | ICD-10-CM | POA: Diagnosis not present

## 2018-10-28 DIAGNOSIS — E7841 Elevated Lipoprotein(a): Secondary | ICD-10-CM | POA: Diagnosis not present

## 2018-10-28 DIAGNOSIS — R0789 Other chest pain: Secondary | ICD-10-CM | POA: Diagnosis not present

## 2018-10-28 MED ORDER — OMEPRAZOLE 40 MG PO CPDR: 40.0000 mg | DELAYED_RELEASE_CAPSULE | Freq: Two times a day (BID) | ORAL | 1 refills | Status: DC

## 2018-10-28 MED ORDER — ATORVASTATIN CALCIUM 40 MG PO TABS: 40.0000 mg | ORAL_TABLET | Freq: Every day | ORAL | 3 refills | Status: DC

## 2018-10-28 MED ORDER — ESCITALOPRAM OXALATE 10 MG PO TABS: 10.0000 mg | ORAL_TABLET | Freq: Every day | ORAL | 2 refills | Status: DC

## 2018-10-28 NOTE — Progress Notes (Signed)
Subjective:  Patient ID: Stephen Gay, male    DOB: 1986-05-19  Age: 33 y.o. MRN: [id number]  CC: CP, right flank pain  HPI Stephen Gay a 33 y.o.malewith no sginficantmedical history presents to f/u on chest pain, right flank pain, and dyspnea. Pt requested pulmonology referral due to progressing dyspnea attributed to working around fumes at the [employer]. Pulmonology notes indicate patient may likely having issues with IBS or anxiety. Pt has not refilled his duloxetine after finishing his first 30 days three months ago.     Last seen here with complaint of substernal chest pain on 07/21/18. Had H pylori IgG tested with a result of 8.17. Says he finished taking his Clarithromycin, Amoxicillin, and Omeprazole with little to no relief of epigastric, substernal, and right flank pain. Feels his substernal chest pain is mildly worse as of the past couple of weeks. Has not taken omeprazole for two weeks. Denies melena, BRBPR, nausea, vomiting, fever, chills, jaundice, swelling, or malaise/fatigue. Work up here and elsewhere shows normal/negative CMP, BMP, hepatic function panel, D-dimer, ESR, UA, and HIV. LDL was elevated at 156 mg/dL but could not start Atorvastatin until completion of H pylori treatment. [place] RUQ 06/19/18 revealed cholelithiasis without evidence of cholecystitis.        ROS Review of Systems  Constitutional: Negative for chills, fever and malaise/fatigue.  Eyes: Negative for blurred vision.  Respiratory: Negative for shortness of breath.   Cardiovascular: Negative for chest pain and palpitations.       Substernal chest pain  Gastrointestinal: Negative for abdominal pain and nausea.       Right flank pain  Genitourinary: Negative for dysuria and hematuria.  Musculoskeletal: Negative for joint pain and myalgias.  Skin: Negative for rash.  Neurological: Negative for tingling and headaches.  Psychiatric/Behavioral: Negative for depression. The patient is not nervous/anxious.      Objective:  BP 117/75 (BP Location: Left Arm, Patient Position: Sitting, Cuff Size: Large)   Pulse (!) 107   Temp 97.9 F (36.6 C) (Oral)   Ht 5' 11.5" (1.816 m)   Wt 234 lb (106.1 kg)   SpO2 97%   BMI 32.18 kg/m   BP/Weight 10/28/2018 09/29/2018 24/23/5361  Systolic BP 443 154 008  Diastolic BP 75 70 90  Wt. (Lbs) 234 237 232  BMI 32.18 32.59 32.36      Physical Exam Vitals signs reviewed.  Constitutional:      Comments: Well developed, well nourished, NAD, polite  HENT:     Head: Normocephalic and atraumatic.  Eyes:     General: No scleral icterus. Neck:     Musculoskeletal: Normal range of motion and neck supple.     Thyroid: No thyromegaly.  Cardiovascular:     Rate and Rhythm: Normal rate and regular rhythm.     Heart sounds: Normal heart sounds.  Pulmonary:     Effort: Pulmonary effort is normal.     Breath sounds: Normal breath sounds.  Abdominal:     General: Bowel sounds are normal. There is no distension.     Palpations: Abdomen is soft. There is no mass.     Tenderness: There is no abdominal tenderness. There is no guarding or rebound.     Hernia: No hernia is present.     Comments: No hepatosplenomegaly  Skin:    General: Skin is warm and dry.     Coloration: Skin is not pale.     Findings: No erythema or rash.  Neurological:  Mental Status: He is alert and oriented to person, place, and time.  Psychiatric:        Mood and Affect: Mood normal.        Behavior: Behavior normal.        Thought Content: Thought content normal.     Comments: Judgement and insight poor      Assessment & Plan:   1. H. pylori infection - H. pylori breath test. Reportedly finished triple therapy more than two weeks ago.   2. Elevated lipoprotein(a) - Begin atorvastatin (LIPITOR) 40 MG tablet; Take 1 tablet (40 mg total) by mouth daily.  Dispense: 90 tablet; Refill: 3  3. GAD (generalized anxiety disorder) - Begin escitalopram (LEXAPRO) 10 MG tablet; Take 1  tablet (10 mg total) by mouth daily.  Dispense: 30 tablet; Refill: 2 *Pt abruptly stated on the way out of the exam room for his breath test, "I have made the decision to cut my uvula off". Says he had occurrences of uvula inflammation and obstruction of airway due to enlarged uvula. 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 Plans on going to his ENT doctor to have uvula removed.   4. Atypical chest pain - Restart omeprazole (PRILOSEC) 40 MG capsule; Take 1 capsule (40 mg total) by mouth 2 (two) times daily.  Dispense: 60 capsule; Refill: 1 - Likely GI in nature. Will await result of H pylori breath test.      Meds ordered this encounter  Medications  . atorvastatin (LIPITOR) 40 MG tablet    Sig: Take 1 tablet (40 mg total) by mouth daily.    Dispense:  90 tablet    Refill:  3    Order Specific Question:   Supervising Provider    Answer:   Charlott Rakes [[id number]]  . escitalopram (LEXAPRO) 10 MG tablet    Sig: Take 1 tablet (10 mg total) by mouth daily.    Dispense:  30 tablet    Refill:  2    Order Specific Question:   Supervising Provider    Answer:   Charlott Rakes [[id number]]  . omeprazole (PRILOSEC) 40 MG capsule    Sig: Take 1 capsule (40 mg total) by mouth 2 (two) times daily.    Dispense:  60 capsule    Refill:  1    Order Specific Question:   Supervising Provider    Answer:   Charlott Rakes [[id number]]    Follow-up: Return in about 4 weeks (around 11/25/2018) for atypical chest pain, right flank pain.   Clent Demark PA

## 2018-10-28 NOTE — Patient Instructions (Signed)
Infeccin por Helicobacter Pylori  Helicobacter Pylori Infection  La infeccin por Helicobacter pylori es una infeccin bacteriana en el estmago. Las infecciones a largo plazo (crnicas) pueden causar irritacin en el estmago (gastritis), lceras en el estmago (lceras gstricas) y lceras en la parte superior del intestino (lceras duodenales). Tener esta infeccin tambin puede aumentar su riesgo de padecer cncer de estmago y un tipo de cncer de los glbulos blancos (linfoma) que afecta al estmago.  Cules son las causas?  Esta infeccin es causada por la bacteria Helicobacter pylori (H. pylori). Muchas personas saludables tienen esta bacteria en el revestimiento del estmago. Adems, la bacteria puede contagiarse de una persona a otra por contacto a travs de la materia fecal (heces) o la saliva. No se sabe por qu algunas personas desarrollan lceras, gastritis o cncer a partir de la bacteria.  Qu incrementa el riesgo?  Es ms probable que tenga esta afeccin si:   Tiene familiares con esta infeccin.   Convive con muchas otras personas, como en un dormitorio.   Es de origen africano, hispano o asitico.  Cules son los signos o los sntomas?  La mayora de las personas con esta infeccin no tienen sntomas. Si tiene sntomas, estos pueden incluir los siguientes:   Acidez estomacal.   Dolor de estmago.   Nuseas.   Vmitos. Puede presentar sangre en el vmito a causa de las lceras.   Prdida del apetito.   Mal aliento.  Cmo se diagnostica?  Esta afeccin se puede diagnosticar en funcin de lo siguiente:   Los sntomas y antecedentes mdicos.   Un examen fsico.   Anlisis de sangre.   Pruebas de heces.   Una prueba del aliento.   Un procedimiento que consiste en colocarle un tubo con una cmara en el extremo por la garganta para examinarle el estmago y la parte superior del intestino (endoscopa alta).   Extraccin y anlisis de una muestra de tejido del revestimiento del  estmago (biopsia). Una biopsia puede realizarse durante una endoscopa alta.  Cmo se trata?    Esta afeccin se trata mediante una combinacin de medicamentos (terapia triple) durante varias semanas. La terapia triple incluye un medicamento para reducir la cantidad de cido en el estmago y dos tipos de antibiticos. Este tratamiento puede reducir el riesgo de padecer cncer.  Despus del tratamiento, es posible que deba volver a realizarse una prueba de H. pylori. En algunos casos, es posible que el tratamiento se deba repetir si no ha eliminado todas las bacterias.  Siga estas indicaciones en su casa:     Tome los medicamentos de venta libre y los recetados solamente como se lo haya indicado el mdico.   Tome los antibiticos como se lo haya indicado el mdico. No deje de tomar los antibiticos aunque comience a sentirse mejor.   Retome sus actividades normales como se lo haya indicado el mdico. Pregntele al mdico qu actividades son seguras para usted.   Tome estas medidas para evitar infecciones futuras:  ? Lvese las manos frecuentemente con agua y jabn. Use desinfectante para manos si no dispone de agua y jabn.  ? No coma alimentos ni beba agua que pueda haber estado en contacto con heces o saliva.   Concurra a todas las visitas de control como se lo haya indicado el mdico. Esto es importante. Es posible que necesite realizarse estudios para comprobar que el tratamiento funcion.  Comunquese con un mdico si sus sntomas:   No mejoran con el tratamiento.   Regresan   despus del tratamiento.  Resumen   La infeccin por Helicobacter pylori es una infeccin estomacal causada por la bacteria Helicobacter pylori (H. pylori).   Esta infeccin puede causar irritacin en el estmago (gastritis), lceras en el estmago (lceras gstricas) y lceras en la parte superior del intestino (lceras duodenales).   Esta afeccin se trata mediante una combinacin de medicamentos (terapia triple) durante varias  semanas.   Tome los antibiticos como se lo haya indicado el mdico. No deje de tomar los antibiticos aunque comience a sentirse mejor.  Esta informacin no tiene como fin reemplazar el consejo del mdico. Asegrese de hacerle al mdico cualquier pregunta que tenga.  Document Released: 01/30/2016 Document Revised: 11/26/2017 Document Reviewed: 11/26/2017  Elsevier Interactive Patient Education  2019 [employer] Inc[employer]

## 2018-10-30 ENCOUNTER — Telehealth (INDEPENDENT_AMBULATORY_CARE_PROVIDER_SITE_OTHER): Payer: Self-pay

## 2018-10-30 LAB — H. PYLORI BREATH TEST: H pylori Breath Test: NEGATIVE

## 2018-10-30 NOTE — Telephone Encounter (Signed)
Patient is aware that test was negative for stomach bacteria. Advised patient that if symptoms persist, to schedule appointment with new provider at clinic. Maryjean Morn, CMA

## 2018-10-30 NOTE — Telephone Encounter (Signed)
-----   Message from Loletta Specter, PA-C sent at 10/30/2018  8:47 AM EST ----- Negative for stomach bacteria. He should return for further work up with new provider if symptoms persist.

## 2018-11-26 ENCOUNTER — Ambulatory Visit (INDEPENDENT_AMBULATORY_CARE_PROVIDER_SITE_OTHER): Payer: 59 | Admitting: Primary Care

## 2018-12-03 ENCOUNTER — Ambulatory Visit (HOSPITAL_COMMUNITY)
Admission: EM | Admit: 2018-12-03 | Discharge: 2018-12-03 | Disposition: A | Discharge status: Home / Self Care | Payer: 59 | Attending: Internal Medicine | Admitting: Internal Medicine

## 2018-12-03 ENCOUNTER — Other Ambulatory Visit: Payer: Self-pay

## 2018-12-03 ENCOUNTER — Encounter (HOSPITAL_COMMUNITY): Payer: Self-pay | Admitting: Emergency Medicine

## 2018-12-03 DIAGNOSIS — J111 Influenza due to unidentified influenza virus with other respiratory manifestations: Secondary | ICD-10-CM | POA: Diagnosis not present

## 2018-12-03 MED ORDER — OSELTAMIVIR PHOSPHATE 75 MG PO CAPS: 75.0000 mg | ORAL_CAPSULE | Freq: Two times a day (BID) | ORAL | 0 refills | Status: DC

## 2018-12-03 NOTE — ED Provider Notes (Signed)
[facility]    CSN: [id number] Arrival date & time: 12/03/18  4967     History   Chief Complaint Chief Complaint  Patient presents with  . URI    HPI Stephen Gay is a 33 y.o. male with no past medical history comes to the urgent care department on account of a 1 day history of cough, sore throat and fever of 1 day duration.  Patient says Stephen Gay was in his usual state of health and his symptoms started yesterday.  Cough is not productive.  Fevers associated with chills.  No over-the-counter medications tried at this time.  Patient has generalized body aches.  No nausea, vomiting or diarrhea.  Patient's daughter was diagnosed with influenza infection and is currently being treated with Tamiflu.  HPI  History reviewed. No pertinent past medical history.  Patient Active Problem List   Diagnosis Date Noted  . Atypical chest pain 08/06/2018  . Dyspnea on exertion 08/05/2018    History reviewed. No pertinent surgical history.     Home Medications    Prior to Admission medications   Medication Sig Start Date End Date Taking? Authorizing Provider  atorvastatin (LIPITOR) 40 MG tablet Take 1 tablet (40 mg total) by mouth daily. 10/28/18   Loletta Specter, PA-C  escitalopram (LEXAPRO) 10 MG tablet Take 1 tablet (10 mg total) by mouth daily. 10/28/18   Loletta Specter, PA-C  omeprazole (PRILOSEC) 40 MG capsule Take 1 capsule (40 mg total) by mouth 2 (two) times daily. 10/28/18   Loletta Specter, PA-C    Family History Family History  Problem Relation Age of Onset  . Healthy Mother   . Healthy Father     Social History Social History   Tobacco Use  . Smoking status: Former Smoker    Packs/day: 0.02    Years: 1.00    Pack years: 0.02    Types: Cigarettes    Last attempt to quit: 10/22/2013    Years since quitting: 5.1  . Smokeless tobacco: Never Used  Substance Use Topics  . Alcohol use: Not Currently  . Drug use: Never     Allergies   Patient has no  known allergies.   Review of Systems Review of Systems  Constitutional: Positive for activity change, chills, fatigue and fever. Negative for appetite change.  HENT: Positive for congestion and sore throat. Negative for ear discharge, ear pain, facial swelling, mouth sores, rhinorrhea, sinus pressure, sinus pain, trouble swallowing and voice change.   Eyes: Negative for pain, discharge and itching.  Respiratory: Positive for cough. Negative for choking, chest tightness, shortness of breath and wheezing.   Cardiovascular: Negative for chest pain and palpitations.  Gastrointestinal: Negative for abdominal distention, abdominal pain, diarrhea, nausea and vomiting.  Musculoskeletal: Positive for arthralgias and myalgias.  Skin: Negative for pallor and rash.  Neurological: Negative for dizziness, tremors, weakness and light-headedness.  Hematological: Negative for adenopathy.  Psychiatric/Behavioral: Negative for confusion and sleep disturbance.     Physical Exam Triage Vital Signs ED Triage Vitals  Enc Vitals Group     BP 12/03/18 0927 120/80     Pulse Rate 12/03/18 0927 78     Resp 12/03/18 0927 18     Temp 12/03/18 0927 (!) 100.8 F (38.2 C)     Temp Source 12/03/18 0927 Oral     SpO2 12/03/18 0927 100 %     Weight --      Height --      Head Circumference --  Peak Flow --      Pain Score 12/03/18 0948 8     Pain Loc --      Pain Edu? --      Excl. in GC? --    No data found.  Updated Vital Signs BP 120/80 (BP Location: Left Arm)   Pulse 78   Temp (!) 100.8 F (38.2 C) (Oral)   Resp 18   SpO2 100%   Visual Acuity Right Eye Distance:   Left Eye Distance:   Bilateral Distance:    Right Eye Near:   Left Eye Near:    Bilateral Near:     Physical Exam Constitutional:      Appearance: Stephen Gay is ill-appearing.  HENT:     Right Ear: Tympanic membrane and ear canal normal.     Left Ear: Tympanic membrane and ear canal normal.     Nose: Congestion present.      Mouth/Throat:     Mouth: Mucous membranes are moist.     Pharynx: Posterior oropharyngeal erythema present. No oropharyngeal exudate.  Eyes:     General:        Right eye: No discharge.        Left eye: No discharge.     Extraocular Movements: Extraocular movements intact.     Conjunctiva/sclera: Conjunctivae normal.  Neck:     Musculoskeletal: Normal range of motion and neck supple.  Cardiovascular:     Rate and Rhythm: Normal rate and regular rhythm.     Pulses: Normal pulses.     Heart sounds: Normal heart sounds.  Pulmonary:     Effort: Pulmonary effort is normal.     Breath sounds: Normal breath sounds. No wheezing or rhonchi.  Abdominal:     General: Abdomen is flat. Bowel sounds are normal.     Palpations: Abdomen is soft.  Musculoskeletal: Normal range of motion.  Skin:    General: Skin is warm.     Capillary Refill: Capillary refill takes less than 2 seconds.     Findings: No erythema or rash.  Neurological:     General: No focal deficit present.     Mental Status: Stephen Gay is alert.      UC Treatments / Results  Labs (all labs ordered are listed, but only abnormal results are displayed) Labs Reviewed - No data to display  EKG None  Radiology No results found.  Procedures Procedures (including critical care time)  Medications Ordered in UC Medications - No data to display  Initial Impression / Assessment and Plan / UC Course  I have reviewed the triage vital signs and the nursing notes.  Pertinent labs & imaging results that were available during my care of the patient were reviewed by me and considered in my medical decision making (see chart for details).     1.  Influenza infection: Tamiflu Tylenol/Motrin for body aches and fever Encourage oral fluid intake Work note given 48 hours to return to work after that  Final Clinical Impressions(s) / UC Diagnoses   Final diagnoses:  None   Discharge Instructions   None    ED Prescriptions    None      Controlled Substance Prescriptions [place] Controlled Substance Registry consulted? No   Merrilee Jansky, MD 12/03/18 1006

## 2018-12-03 NOTE — ED Triage Notes (Signed)
Fever last night 101.7.  Body aches, headache, cough reported today.  Daughter has the flu

## 2019-06-03 ENCOUNTER — Other Ambulatory Visit: Payer: Self-pay

## 2019-06-03 DIAGNOSIS — Z20822 Contact with and (suspected) exposure to covid-19: Secondary | ICD-10-CM

## 2019-06-04 LAB — NOVEL CORONAVIRUS, NAA: SARS-CoV-2, NAA: NOT DETECTED

## 2019-08-18 ENCOUNTER — Other Ambulatory Visit: Payer: Self-pay

## 2019-08-18 DIAGNOSIS — Z20822 Contact with and (suspected) exposure to covid-19: Secondary | ICD-10-CM

## 2019-08-20 LAB — NOVEL CORONAVIRUS, NAA: SARS-CoV-2, NAA: NOT DETECTED

## 2019-08-21 ENCOUNTER — Other Ambulatory Visit: Payer: Self-pay

## 2019-08-21 DIAGNOSIS — Z20822 Contact with and (suspected) exposure to covid-19: Secondary | ICD-10-CM

## 2019-08-22 LAB — NOVEL CORONAVIRUS, NAA: SARS-CoV-2, NAA: NOT DETECTED

## 2020-01-06 ENCOUNTER — Other Ambulatory Visit: Payer: Self-pay

## 2020-01-06 ENCOUNTER — Ambulatory Visit (INDEPENDENT_AMBULATORY_CARE_PROVIDER_SITE_OTHER): Payer: No Typology Code available for payment source

## 2020-01-06 ENCOUNTER — Ambulatory Visit: Payer: No Typology Code available for payment source | Admitting: Emergency Medicine

## 2020-01-06 ENCOUNTER — Encounter: Payer: Self-pay | Admitting: Emergency Medicine

## 2020-01-06 VITALS — BP 121/85 | HR 104 | Temp 97.6°F | Ht 71.0 in | Wt 236.8 lb

## 2020-01-06 DIAGNOSIS — R079 Chest pain, unspecified: Secondary | ICD-10-CM

## 2020-01-06 DIAGNOSIS — Q385 Congenital malformations of palate, not elsewhere classified: Secondary | ICD-10-CM

## 2020-01-06 DIAGNOSIS — Z7689 Persons encountering health services in other specified circumstances: Secondary | ICD-10-CM | POA: Diagnosis not present

## 2020-01-06 LAB — POCT URINALYSIS DIP (MANUAL ENTRY)
Bilirubin, UA: NEGATIVE
Blood, UA: NEGATIVE
Glucose, UA: NEGATIVE mg/dL
Ketones, POC UA: NEGATIVE mg/dL
Leukocytes, UA: NEGATIVE
Nitrite, UA: NEGATIVE
Protein Ur, POC: NEGATIVE mg/dL
Spec Grav, UA: >=1.03 — AB (ref 1.010–1.025)
Urobilinogen, UA: 0.2 E.U./dL
pH, UA: 6.5 (ref 5.0–8.0)

## 2020-01-06 NOTE — Patient Instructions (Addendum)
   If you have lab work done today you will be contacted with your lab results within the next 2 weeks.  If you have not heard from us then please contact us. The fastest way to get your results is to register for My Chart.   IF you received an x-ray today, you will receive an invoice from [facility]. Please contact [facility] at [phone] with questions or concerns regarding your invoice.   IF you received labwork today, you will receive an invoice from [facility]. Please contact [facility] at [phone] with questions or concerns regarding your invoice.   Our billing staff will not be able to assist you with questions regarding bills from these companies.  You will be contacted with the lab results as soon as they are available. The fastest way to get your results is to activate your My Chart account. Instructions are located on the last page of this paperwork. If you have not heard from us regarding the results in 2 weeks, please contact this office.       Mantenimiento de la salud en los hombres Health Maintenance, Male Adoptar un estilo de vida saludable y recibir atencin preventiva son importantes para promover la salud y el bienestar. Consulte al mdico sobre:  El esquema adecuado para hacerse pruebas y exmenes peridicos.  Cosas que puede hacer por su cuenta para prevenir enfermedades y mantenerse sano. Qu debo saber sobre la dieta, el peso y el ejercicio? Consuma una dieta saludable   Consuma una dieta que incluya muchas verduras, frutas, productos lcteos con bajo contenido de grasa y protenas magras.  No consuma muchos alimentos ricos en grasas slidas, azcares agregados o sodio. Mantenga un peso saludable El ndice de masa muscular (IMC) es una medida que puede utilizarse para identificar posibles problemas de peso. Proporciona una estimacin de la grasa corporal basndose en el peso y la altura. Su mdico puede ayudarle a determinar su IMC y  a lograr o mantener un peso saludable. Haga ejercicio con regularidad Haga ejercicio con regularidad. Esta es una de las prcticas ms importantes que puede hacer por su salud. La mayora de los adultos deben seguir estas pautas:  Realizar, al menos, 150minutos de actividad fsica por semana. El ejercicio debe aumentar la frecuencia cardaca y hacerlo transpirar (ejercicio de intensidad moderada).  Hacer ejercicios de fortalecimiento por lo menos dos veces por semana. Agregue esto a su plan de ejercicio de intensidad moderada.  Pasar menos tiempo sentados. Incluso la actividad fsica ligera puede ser beneficiosa. Controle sus niveles de colesterol y lpidos en la sangre Comience a realizarse anlisis de lpidos y colesterol en la sangre a los 20aos y luego reptalos cada 5aos. Es posible que necesite controlar los niveles de colesterol con mayor frecuencia si:  Sus niveles de lpidos y colesterol son altos.  Es mayor de 40aos.  Presenta un alto riesgo de padecer enfermedades cardacas. Qu debo saber sobre las pruebas de deteccin del cncer? Muchos tipos de cncer pueden detectarse de manera temprana y, a menudo, pueden prevenirse. Segn su historia clnica y sus antecedentes familiares, es posible que deba realizarse pruebas de deteccin del cncer en diferentes edades. Esto puede incluir pruebas de deteccin de lo siguiente:  Cncer colorrectal.  Cncer de prstata.  Cncer de piel.  Cncer de pulmn. Qu debo saber sobre la enfermedad cardaca, la diabetes y la hipertensin arterial? Presin arterial y enfermedad cardaca  La hipertensin arterial causa enfermedades cardacas y aumenta el riesgo de accidente cerebrovascular. Es ms   probable que esto se manifieste en las personas que tienen lecturas de presin arterial alta, tienen ascendencia africana o tienen sobrepeso.  Hable con el mdico sobre sus valores de presin arterial deseados.  Hgase controlar la presin  arterial: ? Cada 3 a 5 aos si tiene entre 18 y 39 aos. ? Todos los aos si es mayor de 40aos.  Si tiene entre 65 y 75 aos y es fumador o sola fumar, pregntele al mdico si debe realizarse una prueba de deteccin de aneurisma artico abdominal (AAA) por nica vez. Diabetes Realcese exmenes de deteccin de la diabetes con regularidad. Este anlisis revisa el nivel de azcar en la sangre en ayunas. Hgase las pruebas de deteccin:  Cada tresaos despus de los 45aos de edad si tiene un peso normal y un bajo riesgo de padecer diabetes.  Con ms frecuencia y a partir de una edad inferior si tiene sobrepeso o un alto riesgo de padecer diabetes. Qu debo saber sobre la prevencin de infecciones? Hepatitis B Si tiene un riesgo ms alto de contraer hepatitis B, debe someterse a un examen de deteccin de este virus. Hable con el mdico para averiguar si tiene riesgo de contraer la infeccin por hepatitis B. Hepatitis C Se recomienda un anlisis de sangre para:  Todos los que nacieron entre 1945 y 1965.  Todas las personas que tengan un riesgo de haber contrado hepatitis C. Enfermedades de transmisin sexual (ETS)  Debe realizarse pruebas de deteccin de ITS todos los aos, incluidas la gonorrea y la clamidia, si: ? Es sexualmente activo y es menor de 24aos. ? Es mayor de 24aos, y el mdico le informa que corre riesgo de tener este tipo de infecciones. ? La actividad sexual ha cambiado desde que le hicieron la ltima prueba de deteccin y tiene un riesgo mayor de tener clamidia o gonorrea. Pregntele al mdico si usted tiene riesgo.  Pregntele al mdico si usted tiene un alto riesgo de contraer VIH. El mdico tambin puede recomendarle un medicamento recetado para ayudar a evitar la infeccin por el VIH. Si elige tomar medicamentos para prevenir el VIH, primero debe hacerse los anlisis de deteccin del VIH. Luego debe hacerse anlisis cada 3meses mientras est tomando los  medicamentos. Siga estas instrucciones en su casa: Estilo de vida  No consuma ningn producto que contenga nicotina o tabaco, como cigarrillos, cigarrillos electrnicos y tabaco de mascar. Si necesita ayuda para dejar de fumar, consulte al mdico.  No consuma drogas.  No comparta agujas.  Solicite ayuda a su mdico si necesita apoyo o informacin para abandonar las drogas. Consumo de alcohol  No beba alcohol si el mdico se lo prohbe.  Si bebe alcohol: ? Limite la cantidad que consume de 0 a 2 medidas por da. ? Est atento a la cantidad de alcohol que hay en las bebidas que toma. En los [place], una medida equivale a una botella de cerveza de 12oz (355ml), un vaso de vino de 5oz (148ml) o un vaso de una bebida alcohlica de alta graduacin de 1oz (44ml). Instrucciones generales  Realcese los estudios de rutina de la salud, dentales y de la vista.  Mantngase al da con las vacunas.  Infrmele a su mdico si: ? Se siente deprimido con frecuencia. ? Alguna vez ha sido vctima de maltrato o no se siente seguro en su casa. Resumen  Adoptar un estilo de vida saludable y recibir atencin preventiva son importantes para promover la salud y el bienestar.  Siga las instrucciones del mdico   acerca de una dieta saludable, el ejercicio y la realizacin de pruebas o exmenes para detectar enfermedades.  Siga las instrucciones del mdico con respecto al control del colesterol y la presin arterial. Esta informacin no tiene como fin reemplazar el consejo del mdico. Asegrese de hacerle al mdico cualquier pregunta que tenga. Document Revised: 10/29/2018 Document Reviewed: 10/29/2018 Elsevier Patient Education  2020 Elsevier Inc.  

## 2020-01-06 NOTE — Progress Notes (Signed)
Stephen Gay 34 y.o.   Chief Complaint  Patient presents with  . Establish Care  . right side pain    going on a year   . enlarged uvula    HISTORY OF PRESENT ILLNESS: This is a 34 y.o. male here to establish care with me, first visit to this office. Complaining of right-sided lateral chest pain for least a year.  Sharp pain that waxes and wanes.  Works in a Environmental manager as a Curator.  Physical work.  Right-handed.  No associated symptoms.  Non-smoker. Also has a history of chronically enlarged uvula.  Requesting ENT referral. No other complaints or medical concerns today.  HPI   Prior to Admission medications   Not on File    No Known Allergies  There are no problems to display for this patient.   History reviewed. No pertinent past medical history.  History reviewed. No pertinent surgical history.  Social History   Socioeconomic History  . Marital status: Married    Spouse name: Not on file  . Number of children: Not on file  . Years of education: Not on file  . Highest education level: Not on file  Occupational History  . Not on file  Tobacco Use  . Smoking status: Former Smoker    Packs/day: 0.02    Years: 1.00    Pack years: 0.02    Types: Cigarettes    Quit date: 10/22/2013    Years since quitting: 6.2  . Smokeless tobacco: Never Used  Substance and Sexual Activity  . Alcohol use: Not Currently  . Drug use: Never  . Sexual activity: Yes  Other Topics Concern  . Not on file  Social History Narrative  . Not on file   Social Determinants of Health   Financial Resource Strain:   . Difficulty of Paying Living Expenses:   Food Insecurity:   . Worried About Charity fundraiser in the Last Year:   . Arboriculturist in the Last Year:   Transportation Needs:   . Film/video editor (Medical):   Marland Kitchen Lack of Transportation (Non-Medical):   Physical Activity:   . Days of Exercise per Week:   . Minutes of Exercise per Session:   Stress:   . Feeling  of Stress :   Social Connections:   . Frequency of Communication with Friends and Family:   . Frequency of Social Gatherings with Friends and Family:   . Attends Religious Services:   . Active Member of Clubs or Organizations:   . Attends Archivist Meetings:   Marland Kitchen Marital Status:   Intimate Partner Violence:   . Fear of Current or Ex-Partner:   . Emotionally Abused:   Marland Kitchen Physically Abused:   . Sexually Abused:     Family History  Problem Relation Age of Onset  . Healthy Mother   . Healthy Father      Review of Systems  Constitutional: Negative.  Negative for chills and fever.  HENT: Negative.  Negative for congestion and sore throat.   Respiratory: Negative.  Negative for cough, shortness of breath and wheezing.   Cardiovascular: Negative.  Negative for palpitations, claudication and leg swelling.  Gastrointestinal: Negative for abdominal pain, diarrhea, nausea and vomiting.  Genitourinary: Negative.  Negative for dysuria and hematuria.  Skin: Negative.  Negative for rash.  Neurological: Negative.  Negative for dizziness, sensory change, focal weakness and headaches.  All other systems reviewed and are negative.  Vitals:  01/06/20 1615  BP: 121/85  Pulse: (!) 104  Temp: 97.6 F (36.4 C)  SpO2: 97%     Physical Exam Vitals reviewed.  Constitutional:      Appearance: Normal appearance.  HENT:     Head: Normocephalic.  Eyes:     Extraocular Movements: Extraocular movements intact.     Conjunctiva/sclera: Conjunctivae normal.     Pupils: Pupils are equal, round, and reactive to light.  Neck:     Vascular: No carotid bruit.  Cardiovascular:     Rate and Rhythm: Normal rate and regular rhythm.     Pulses: Normal pulses.     Heart sounds: Normal heart sounds.  Pulmonary:     Effort: Pulmonary effort is normal.     Breath sounds: Normal breath sounds.  Abdominal:     General: Bowel sounds are normal. There is no distension.     Palpations: Abdomen is  soft.     Tenderness: There is no abdominal tenderness.  Musculoskeletal:        General: Normal range of motion.     Cervical back: Normal range of motion and neck supple. No tenderness.  Lymphadenopathy:     Cervical: No cervical adenopathy.  Skin:    General: Skin is warm and dry.     Capillary Refill: Capillary refill takes less than 2 seconds.  Neurological:     General: No focal deficit present.     Mental Status: He is alert and oriented to person, place, and time.  Psychiatric:        Mood and Affect: Mood normal.        Behavior: Behavior normal.     DG Chest 2 View  Result Date: 01/06/2020 CLINICAL DATA:  Chest pain EXAM: CHEST - 2 VIEW COMPARISON:  August 05, 2018 FINDINGS: Lungs are clear. Heart size and pulmonary vascularity are normal. No adenopathy. No pneumothorax. No bone lesions. IMPRESSION: No abnormality noted. Electronically Signed   By: Bretta Bang III M.D.   On: 01/06/2020 16:46   Results for orders placed or performed in visit on 01/06/20 (from the past 24 hour(s))  POCT urinalysis dipstick     Status: Abnormal   Collection Time: 01/06/20  4:52 PM  Result Value Ref Range   Color, UA yellow yellow   Clarity, UA clear clear   Glucose, UA negative negative mg/dL   Bilirubin, UA negative negative   Ketones, POC UA negative negative mg/dL   Spec Grav, UA >=7.858 (A) 1.010 - 1.025   Blood, UA negative negative   pH, UA 6.5 5.0 - 8.0   Protein Ur, POC negative negative mg/dL   Urobilinogen, UA 0.2 0.2 or 1.0 E.U./dL   Nitrite, UA Negative Negative   Leukocytes, UA Negative Negative    ASSESSMENT & PLAN: Therin was seen today for establish care, right side pain and enlarged uvula.  Diagnoses and all orders for this visit:  Right-sided chest pain -     DG Chest 2 View -     Comprehensive metabolic panel -     CBC with Differential/Platelet -     POCT urinalysis dipstick  Long uvula -     Ambulatory referral to ENT  Encounter to establish  care    Patient Instructions       If you have lab work done today you will be contacted with your lab results within the next 2 weeks.  If you have not heard from [place] then please contact us. The fastest  way to get your results is to register for My Chart.   IF you received an x-ray today, you will receive an invoice from [facility]. Please contact [facility] at [phone] with questions or concerns regarding your invoice.   IF you received labwork today, you will receive an invoice from [facility]. Please contact [facility] at [phone] with questions or concerns regarding your invoice.   Our billing staff will not be able to assist you with questions regarding bills from these companies.  You will be contacted with the lab results as soon as they are available. The fastest way to get your results is to activate your My Chart account. Instructions are located on the last page of this paperwork. If you have not heard from [place] regarding the results in 2 weeks, please contact this office.      Mantenimiento de Research officer, political party, Male Adoptar un estilo de vida saludable y recibir atencin preventiva son importantes para promover la salud y Counsellor. Consulte al mdico sobre:  El esquema adecuado para hacerse pruebas y exmenes peridicos.  Cosas que puede hacer por su cuenta para prevenir enfermedades y [place] sano. Qu debo saber sobre la dieta, el peso y el ejercicio? Consuma una dieta saludable   Consuma una dieta que incluya muchas verduras, frutas, productos lcteos con bajo contenido de [place] y Associate Professor.  No consuma muchos alimentos ricos en grasas slidas, azcares agregados o sodio. Mantenga un peso saludable El ndice de masa muscular [facility]) es una medida que puede utilizarse para identificar posibles problemas de [place]. Proporciona una estimacin de la grasa corporal basndose en el peso y la altura. Su mdico  puede ayudarle a Engineer, site IMC y a Personnel officer o Pharmacologist un peso saludable. Haga ejercicio con regularidad Haga ejercicio con regularidad. Esta es una de las prcticas ms importantes que puede hacer por su salud. La mayora de los adultos deben seguir estas pautas:  Education officer, environmental, al menos, de actividad fsica por semana. El ejercicio debe aumentar la frecuencia cardaca y Media planner transpirar (ejercicio de intensidad moderada).  Hacer ejercicios de fortalecimiento por lo [employer] por semana. Agregue esto a su plan de ejercicio de intensidad moderada.  Pasar menos tiempo sentados. Incluso la actividad fsica ligera puede ser beneficiosa. Controle sus niveles de colesterol y lpidos en la sangre Comience a realizarse anlisis de lpidos y Oncologist en la sangre a los 20aos y luego reptalos cada 5aos. Es posible que Insurance underwriter los niveles de colesterol con mayor frecuencia si:  Sus niveles de lpidos y colesterol son altos.  Es mayor de 40aos.  Presenta un alto riesgo de padecer enfermedades cardacas. Qu debo saber sobre las pruebas de deteccin del cncer? Muchos tipos de cncer pueden detectarse de manera temprana y, a menudo, pueden prevenirse. Segn su historia clnica y sus antecedentes familiares, es posible que deba realizarse pruebas de deteccin del cncer en diferentes edades. Esto puede incluir pruebas de deteccin de lo siguiente:  Building services engineer.  Cncer de prstata.  Cncer de piel.  Cncer de pulmn. Qu debo saber sobre la enfermedad cardaca, la diabetes y la hipertensin arterial? Presin arterial y enfermedad cardaca  La hipertensin arterial causa enfermedades cardacas y [place] el riesgo de accidente cerebrovascular. Es ms probable que esto se manifieste en las personas que tienen lecturas de presin arterial alta, tienen ascendencia africana o tienen sobrepeso.  Hable con el mdico sobre sus valores de presin arterial  deseados.  Hgase controlar la  presin arterial: ? Cada 3 a 5 aos si tiene entre 18 y 42 aos. ? Todos los aos si es mayor de [place].  Si tiene entre 65 y 31 aos y es fumador o Insurance underwriter, pregntele al mdico si debe realizarse una prueba de deteccin de aneurisma artico abdominal (AAA) por nica vez. Diabetes Realcese exmenes de deteccin de la diabetes con regularidad. Este anlisis revisa el nivel de azcar en la sangre en [place]. Hgase las pruebas de deteccin:  Cada tresaos despus de los 45aos de edad si tiene un peso normal y un bajo riesgo de padecer diabetes.  Con ms frecuencia y a partir de [place] edad inferior si tiene sobrepeso o un alto riesgo de padecer diabetes. Qu debo saber sobre la prevencin de infecciones? Hepatitis B Si tiene un riesgo ms alto de contraer hepatitis B, debe someterse a un examen de deteccin de este virus. Hable con el mdico para averiguar si tiene riesgo de contraer la infeccin por hepatitis B. Hepatitis C Se recomienda un anlisis de [place] para:  Todos los que nacieron entre 1945 y [phone].  Todas las personas que tengan un riesgo de haber contrado hepatitis C. Enfermedades de transmisin sexual (ETS)  Debe realizarse pruebas de deteccin de ITS todos los aos, incluidas la gonorrea y la clamidia, si: ? Es sexualmente activo y es menor de 24aos. ? Es mayor de 24aos, y Public affairs consultant informa que corre riesgo de tener este tipo de infecciones. ? La actividad sexual ha cambiado desde que le hicieron la ltima prueba de deteccin y tiene un riesgo mayor de Warehouse manager clamidia o Copy. Pregntele al mdico si usted tiene riesgo.  Pregntele al mdico si usted tiene un alto riesgo de Primary school teacher VIH. El mdico tambin puede recomendarle un medicamento recetado para ayudar a evitar la infeccin por el VIH. Si elige tomar medicamentos para prevenir el VIH, primero debe [employer] de deteccin del VIH. Luego debe hacerse anlisis cada  mientras est tomando los medicamentos. Siga estas instrucciones en su casa: Estilo de vida  No consuma ningn producto que contenga nicotina o tabaco, como cigarrillos, cigarrillos electrnicos y tabaco de Theatre manager. Si necesita ayuda para dejar de fumar, consulte al mdico.  No consuma drogas.  No comparta agujas.  Solicite ayuda a su mdico si necesita apoyo o informacin para abandonar las drogas. Consumo de alcohol  No beba alcohol si el mdico se lo prohbe.  Si bebe alcohol: ? Limite la cantidad que consume de 0 a 2 medidas por da. ? Est atento a la cantidad de alcohol que hay en las bebidas que toma. En los [place], una medida equivale a una botella de cerveza de 12oz ( ), un vaso de vino de 5oz ( ) o un vaso de una bebida alcohlica de alta graduacin de 1oz (67ml). Instrucciones generales  Realcese los estudios de rutina de la salud, dentales y de Wellsite geologist.  Mantngase al da con las vacunas.  Infrmele a su mdico si: ? Se siente deprimido con frecuencia. ? Alguna vez ha sido vctima de [place] o no se siente seguro en su casa. Resumen  Adoptar un estilo de vida saludable y recibir atencin preventiva son importantes para promover la salud y Counsellor.  Siga las instrucciones del mdico acerca de una dieta saludable, el ejercicio y la realizacin de pruebas o exmenes para Hotel manager.  Siga las instrucciones del mdico con respecto al control del colesterol y la presin arterial. Esta informacin no tiene Public librarian  fin reemplazar el consejo del mdico. Asegrese de hacerle al mdico cualquier pregunta que tenga. Document Revised: 10/29/2018 Document Reviewed: 10/29/2018 Elsevier Patient Education  2020 Elsevier Inc.      Edwina BarthMiguel Ceniyah Thorp, MD [facility]

## 2020-01-07 ENCOUNTER — Encounter: Payer: Self-pay | Admitting: Emergency Medicine

## 2020-01-07 LAB — CBC WITH DIFFERENTIAL/PLATELET
Basophils Absolute: 0.1 10*3/uL (ref 0.0–0.2)
Basos: 1 %
EOS (ABSOLUTE): 0.3 10*3/uL (ref 0.0–0.4)
Eos: 4 %
Hematocrit: 45.2 % (ref 37.5–51.0)
Hemoglobin: 15.2 g/dL (ref 13.0–17.7)
Immature Grans (Abs): 0 10*3/uL (ref 0.0–0.1)
Immature Granulocytes: 1 %
Lymphocytes Absolute: 2.5 10*3/uL (ref 0.7–3.1)
Lymphs: 33 %
MCH: 28.3 pg (ref 26.6–33.0)
MCHC: 33.6 g/dL (ref 31.5–35.7)
MCV: 84 fL (ref 79–97)
Monocytes Absolute: 0.7 10*3/uL (ref 0.1–0.9)
Monocytes: 9 %
Neutrophils Absolute: 4 10*3/uL (ref 1.4–7.0)
Neutrophils: 52 %
Platelets: 243 10*3/uL (ref 150–450)
RBC: 5.38 x10E6/uL (ref 4.14–5.80)
RDW: 12.8 % (ref 11.6–15.4)
WBC: 7.6 10*3/uL (ref 3.4–10.8)

## 2020-01-07 LAB — COMPREHENSIVE METABOLIC PANEL
ALT: 19 IU/L (ref 0–44)
AST: 21 IU/L (ref 0–40)
Albumin/Globulin Ratio: 1.7 (ref 1.2–2.2)
Albumin: 4.8 g/dL (ref 4.0–5.0)
Alkaline Phosphatase: 81 IU/L (ref 39–117)
BUN/Creatinine Ratio: 14 (ref 9–20)
BUN: 15 mg/dL (ref 6–20)
Bilirubin Total: 0.4 mg/dL (ref 0.0–1.2)
CO2: 21 mmol/L (ref 20–29)
Calcium: 9.5 mg/dL (ref 8.7–10.2)
Chloride: 103 mmol/L (ref 96–106)
Creatinine, Ser: 1.04 mg/dL (ref 0.76–1.27)
GFR calc Af Amer: 109 mL/min/{1.73_m2} (ref >59)
GFR calc non Af Amer: 95 mL/min/{1.73_m2} (ref >59)
Globulin, Total: 2.9 g/dL (ref 1.5–4.5)
Glucose: 83 mg/dL (ref 65–99)
Potassium: 4.1 mmol/L (ref 3.5–5.2)
Sodium: 139 mmol/L (ref 134–144)
Total Protein: 7.7 g/dL (ref 6.0–8.5)

## 2020-01-09 ENCOUNTER — Ambulatory Visit: Payer: Self-pay | Attending: Internal Medicine

## 2020-01-09 DIAGNOSIS — Z23 Encounter for immunization: Secondary | ICD-10-CM

## 2020-01-09 NOTE — Progress Notes (Signed)
   Covid-19 Vaccination Clinic  Name:  Stephen Gay    MRN: [id number] DOB: October 22, 1986  01/09/2020  Mr. Miles was observed post Covid-19 immunization for 15 minutes without incident. He was provided with Vaccine Information Sheet and instruction to access the V-Safe system.   [name] was instructed to call 911 with any severe reactions post vaccine: Marland Kitchen Difficulty breathing  . Swelling of face and throat  . A fast heartbeat  . A bad rash all over body  . Dizziness and weakness   Immunizations Administered    Name Date Dose VIS Date Route   Pfizer COVID-19 Vaccine 01/09/2020  8:26 AM 0.3 mL 10/02/2019 Intramuscular   Manufacturer: [employer], [employer]   Lot: [id number]   NDC: [address]-4

## 2020-02-03 ENCOUNTER — Ambulatory Visit: Payer: Self-pay | Attending: Internal Medicine

## 2020-02-03 DIAGNOSIS — Z23 Encounter for immunization: Secondary | ICD-10-CM

## 2020-02-03 NOTE — Progress Notes (Signed)
   Covid-19 Vaccination Clinic  Name:  Camerin Ladouceur    MRN: [id number] DOB: January 18, 1986  02/03/2020  Mr. Schlabach was observed post Covid-19 immunization for 15 minutes without incident. He was provided with Vaccine Information Sheet and instruction to access the V-Safe system.   Mr. Creger was instructed to call 911 with any severe reactions post vaccine: Marland Kitchen Difficulty breathing  . Swelling of face and throat  . A fast heartbeat  . A bad rash all over body  . Dizziness and weakness   Immunizations Administered    Name Date Dose VIS Date Route   Pfizer COVID-19 Vaccine 02/03/2020  9:36 AM 0.3 mL 10/02/2019 Intramuscular   Manufacturer: [employer], [employer]   Lot: [id number]   NDC: [address]-5

## 2020-02-10 ENCOUNTER — Ambulatory Visit (INDEPENDENT_AMBULATORY_CARE_PROVIDER_SITE_OTHER): Payer: No Typology Code available for payment source | Admitting: Otolaryngology

## 2020-02-10 ENCOUNTER — Other Ambulatory Visit: Payer: Self-pay

## 2020-02-10 VITALS — Temp 97.9°F

## 2020-02-10 DIAGNOSIS — K1379 Other lesions of oral mucosa: Secondary | ICD-10-CM | POA: Diagnosis not present

## 2020-02-10 DIAGNOSIS — J342 Deviated nasal septum: Secondary | ICD-10-CM | POA: Diagnosis not present

## 2020-02-10 NOTE — Progress Notes (Signed)
HPI: Stephen Gay is a 34 y.o. male who presents for evaluation of nasal obstruction and a chronically enlarged uvula.  Patient apparently snores on a regular basis.  He feels like he sleeps fairly well and does not complain of symptoms of obstructive sleep apnea.  He does complain of trouble breathing through his nose has used Flonase in the past but is not using it regularly.  He saw [facility]NT over 5 years ago and they discussed surgery with him on his nose as well as removing the uvula but he elected not to have surgery at that time.  No past medical history on file. No past surgical history on file. Social History   Socioeconomic History  . Marital status: Married    Spouse name: Not on file  . Number of children: Not on file  . Years of education: Not on file  . Highest education level: Not on file  Occupational History  . Not on file  Tobacco Use  . Smoking status: Former Smoker    Packs/day: 0.02    Years: 1.00    Pack years: 0.02    Types: Cigarettes    Quit date: 10/22/2013    Years since quitting: 6.3  . Smokeless tobacco: Never Used  Substance and Sexual Activity  . Alcohol use: Not Currently  . Drug use: Never  . Sexual activity: Yes  Other Topics Concern  . Not on file  Social History Narrative  . Not on file   Social Determinants of Health   Financial Resource Strain:   . Difficulty of Paying Living Expenses:   Food Insecurity:   . Worried About Programme researcher, broadcasting/film/video in the Last Year:   . Barista in the Last Year:   Transportation Needs:   . Freight forwarder (Medical):   Marland Kitchen Lack of Transportation (Non-Medical):   Physical Activity:   . Days of Exercise per Week:   . Minutes of Exercise per Session:   Stress:   . Feeling of Stress :   Social Connections:   . Frequency of Communication with Friends and Family:   . Frequency of Social Gatherings with Friends and Family:   . Attends Religious Services:   . Active Member of Clubs or  Organizations:   . Attends Banker Meetings:   Marland Kitchen Marital Status:    Family History  Problem Relation Age of Onset  . Healthy Mother   . Healthy Father    No Known Allergies Prior to Admission medications   Not on File     Positive ROS: Otherwise negative  All other systems have been reviewed and were otherwise negative with the exception of those mentioned in the HPI and as above.  Physical Exam: Constitutional: Alert, well-appearing, no acute distress Ears: External ears without lesions or tenderness. Ear canals are clear bilaterally with intact, clear TMs.  Nasal: External nose without lesions. Septum with moderate deviation with anterior deviation to the right in more posterior deviation to the left with large septal spur.  There are no polyps.  He has moderate rhinitis.. Clear nasal passages with no signs of infection. Oral: Lips and gums without lesions. Tongue and palate mucosa without lesions. Posterior oropharynx clear.  Tonsils are small and symmetric bilaterally.  Patient does have a very elongated uvula. Neck: No palpable adenopathy or masses Respiratory: Breathing comfortably  Skin: No facial/neck lesions or rash noted.  Procedures  Assessment: Septal deviation with turbid' and nasal obstruction. History of chronic  snoring Elongated uvula.  Plan: Recommended initially regular use of nasal steroid spray Nasacort 2 sprays each nostril at night to see if this will help with his snoring as well as nasal breathing. If he does not get adequate relief with the nasal steroid spray briefly discussed surgery with him which would involve septoplasty turbinate reductions and uvulectomy.  Radene Journey, MD

## 2020-03-09 ENCOUNTER — Ambulatory Visit (INDEPENDENT_AMBULATORY_CARE_PROVIDER_SITE_OTHER): Payer: No Typology Code available for payment source | Admitting: Otolaryngology

## 2020-03-14 ENCOUNTER — Other Ambulatory Visit: Payer: Self-pay

## 2020-03-14 ENCOUNTER — Ambulatory Visit (HOSPITAL_COMMUNITY)
Admission: EM | Admit: 2020-03-14 | Discharge: 2020-03-14 | Disposition: A | Discharge status: Home / Self Care | Payer: No Typology Code available for payment source | Attending: Family Medicine | Admitting: Family Medicine

## 2020-03-14 DIAGNOSIS — J029 Acute pharyngitis, unspecified: Secondary | ICD-10-CM | POA: Diagnosis not present

## 2020-03-14 DIAGNOSIS — Z20822 Contact with and (suspected) exposure to covid-19: Secondary | ICD-10-CM | POA: Insufficient documentation

## 2020-03-14 DIAGNOSIS — Z20818 Contact with and (suspected) exposure to other bacterial communicable diseases: Secondary | ICD-10-CM

## 2020-03-14 DIAGNOSIS — Z87891 Personal history of nicotine dependence: Secondary | ICD-10-CM | POA: Insufficient documentation

## 2020-03-14 MED ORDER — AMOXICILLIN 875 MG PO TABS: 875.0000 mg | ORAL_TABLET | Freq: Two times a day (BID) | ORAL | 0 refills | Status: AC

## 2020-03-14 NOTE — ED Triage Notes (Addendum)
Pt c/o acute onset sore throat, congestion, runny nose since Saturday. Pt reports his daughter was just diagnosed with strep throat.  Denies fever, chills, body aches, abdom pain, n/v/d.  Pt states he took Theraflu which slightly improved symptoms.  Pt states he received both COVID vaccines.

## 2020-03-14 NOTE — Discharge Instructions (Signed)
TAKE THE ANTIBIOTIC 2 X A DAY RETURN IF NOT IMPROVING IN A FEW DAYS

## 2020-03-14 NOTE — ED Provider Notes (Signed)
[facility]    CSN: [id number] Arrival date & time: 03/14/20  1635      History   Chief Complaint Chief Complaint  Patient presents with  . Sore Throat    HPI Stephen Gay is a 34 y.o. male.   HPI Patient is here for sore throat, runny nose, and some red irritated eyes.  He states his daughter has strep throat.  He is having him symptoms since yesterday.  No fever or chills.  No headache.  No nausea or vomiting. He has had both of his Covid vaccinations No past medical history on file.  There are no problems to display for this patient.   No past surgical history on file.     Home Medications    Prior to Admission medications   Medication Sig Start Date End Date Taking? Authorizing Provider  amoxicillin (AMOXIL) 875 MG tablet Take 1 tablet (875 mg total) by mouth 2 (two) times daily for 10 days. 03/14/20 03/24/20  Eustace Moore, MD    Family History Family History  Problem Relation Age of Onset  . Healthy Mother   . Healthy Father     Social History Social History   Tobacco Use  . Smoking status: Former Smoker    Packs/day: 0.02    Years: 1.00    Pack years: 0.02    Types: Cigarettes    Quit date: 10/22/2013    Years since quitting: 6.3  . Smokeless tobacco: Never Used  Substance Use Topics  . Alcohol use: Not Currently  . Drug use: Never     Allergies   Patient has no known allergies.   Review of Systems Review of Systems  HENT: Positive for congestion, rhinorrhea and sore throat.   Eyes: Positive for redness.    Physical Exam Triage Vital Signs ED Triage Vitals  Enc Vitals Group     BP 03/14/20 1811 114/80     Pulse Rate 03/14/20 1809 94     Resp 03/14/20 1809 18     Temp 03/14/20 1809 98.6 F (37 C)     Temp Source 03/14/20 1809 Oral     SpO2 03/14/20 1809 99 %     Weight --      Height --      Head Circumference --      Peak Flow --      Pain Score 03/14/20 1810 0     Pain Loc --      Pain Edu? --      Excl. in  GC? --    No data found.  Updated Vital Signs BP 114/80 (BP Location: Left Arm)   Pulse 94   Temp 98.6 F (37 C) (Oral)   Resp 18   SpO2 99%       Physical Exam Constitutional:      General: He is not in acute distress.    Appearance: He is well-developed and normal weight.  HENT:     Head: Normocephalic and atraumatic.     Right Ear: Tympanic membrane and ear canal normal.     Left Ear: Tympanic membrane normal.     Nose: Congestion and rhinorrhea present.     Mouth/Throat:     Mouth: Mucous membranes are moist.     Pharynx: Posterior oropharyngeal erythema present.     Comments: Posterior pharyngeal erythema no exudate Eyes:     Pupils: Pupils are equal, round, and reactive to light.     Comments: Bilateral conjunctival injection  Cardiovascular:     Rate and Rhythm: Normal rate and regular rhythm.  Pulmonary:     Effort: Pulmonary effort is normal. No respiratory distress.  Musculoskeletal:        General: Normal range of motion.     Cervical back: Normal range of motion.  Lymphadenopathy:     Cervical: Cervical adenopathy present.  Skin:    General: Skin is warm and dry.  Neurological:     Mental Status: He is alert.  Psychiatric:        Mood and Affect: Mood normal.        Behavior: Behavior normal.      UC Treatments / Results  Labs (all labs ordered are listed, but only abnormal results are displayed) Labs Reviewed  SARS CORONAVIRUS 2 (TAT 6-24 HRS)    EKG   Radiology No results found.  Procedures Procedures (including critical care time)  Medications Ordered in UC Medications - No data to display  Initial Impression / Assessment and Plan / UC Course  I have reviewed the triage vital signs and the nursing notes.  Pertinent labs & imaging results that were available during my care of the patient were reviewed by me and considered in my medical decision making (see chart for details).     With direct, close strep exposure I think  treatment is prudent. Final Clinical Impressions(s) / UC Diagnoses   Final diagnoses:  Exposure to strep throat  Sore throat     Discharge Instructions     TAKE THE ANTIBIOTIC 2 X A DAY RETURN IF NOT IMPROVING IN A FEW DAYS   ED Prescriptions    Medication Sig Dispense Auth. Provider   amoxicillin (AMOXIL) 875 MG tablet Take 1 tablet (875 mg total) by mouth 2 (two) times daily for 10 days. 20 tablet Raylene Everts, MD     PDMP not reviewed this encounter.   Raylene Everts, MD 03/14/20 [phone]

## 2020-03-15 LAB — SARS CORONAVIRUS 2 (TAT 6-24 HRS): SARS Coronavirus 2: NEGATIVE

## 2020-03-24 ENCOUNTER — Other Ambulatory Visit: Payer: Self-pay

## 2020-03-24 ENCOUNTER — Ambulatory Visit (INDEPENDENT_AMBULATORY_CARE_PROVIDER_SITE_OTHER): Payer: No Typology Code available for payment source | Admitting: Otolaryngology

## 2020-03-24 ENCOUNTER — Encounter (INDEPENDENT_AMBULATORY_CARE_PROVIDER_SITE_OTHER): Payer: Self-pay | Admitting: Otolaryngology

## 2020-03-24 VITALS — Temp 97.2°F

## 2020-03-24 DIAGNOSIS — K1379 Other lesions of oral mucosa: Secondary | ICD-10-CM

## 2020-03-24 DIAGNOSIS — J342 Deviated nasal septum: Secondary | ICD-10-CM

## 2020-03-24 DIAGNOSIS — J343 Hypertrophy of nasal turbinates: Secondary | ICD-10-CM

## 2020-03-24 NOTE — Progress Notes (Signed)
HPI: Stephen Gay is a 34 y.o. male who returns today for evaluation of chronic nasal obstruction.  He apparently fractured his nose when he was young and has always had trouble breathing through his nose.  He has tried nasal steroid spray that helped a little bit but still has continued trouble breathing through his nose especially on the left side.  He also states that he has a long uvula and just recently bit the uvula causing it to bleed.  He has had history of strep and just completed a course of antibiotics for strep which he got from one of his children.  But he does not usually get strep. He is otherwise healthy He is on no medications NKDA.  No past medical history on file. No past surgical history on file. Social History   Socioeconomic History  . Marital status: Married    Spouse name: Not on file  . Number of children: Not on file  . Years of education: Not on file  . Highest education level: Not on file  Occupational History  . Not on file  Tobacco Use  . Smoking status: Former Smoker    Packs/day: 0.02    Years: 1.00    Pack years: 0.02    Types: Cigarettes    Quit date: 10/22/2013    Years since quitting: 6.4  . Smokeless tobacco: Never Used  . Tobacco comment: Pt smoked on and off.  Substance and Sexual Activity  . Alcohol use: Not Currently  . Drug use: Never  . Sexual activity: Yes  Other Topics Concern  . Not on file  Social History Narrative  . Not on file   Social Determinants of Health   Financial Resource Strain:   . Difficulty of Paying Living Expenses:   Food Insecurity:   . Worried About Charity fundraiser in the Last Year:   . Arboriculturist in the Last Year:   Transportation Needs:   . Film/video editor (Medical):   Marland Kitchen Lack of Transportation (Non-Medical):   Physical Activity:   . Days of Exercise per Week:   . Minutes of Exercise per Session:   Stress:   . Feeling of Stress :   Social Connections:   . Frequency of Communication  with Friends and Family:   . Frequency of Social Gatherings with Friends and Family:   . Attends Religious Services:   . Active Member of Clubs or Organizations:   . Attends Archivist Meetings:   Marland Kitchen Marital Status:    Family History  Problem Relation Age of Onset  . Healthy Mother   . Healthy Father    No Known Allergies Prior to Admission medications   Medication Sig Start Date End Date Taking? Authorizing Provider  amoxicillin (AMOXIL) 875 MG tablet Take 1 tablet (875 mg total) by mouth 2 (two) times daily for 10 days. Patient not taking: Reported on 03/24/2020 03/14/20 03/24/20  Raylene Everts, MD     Positive ROS: Otherwise negative  All other systems have been reviewed and were otherwise negative with the exception of those mentioned in the HPI and as above.  Physical Exam: Constitutional: Alert, well-appearing, no acute distress Ears: External ears without lesions or tenderness. Ear canals are clear bilaterally with intact, clear TMs.  Nasal: External nose without lesions. Septum deviated posterior to the left and anteriorly to the right.  He has moderate turbinate per trophy.  No polyps noted.  No signs of infection..  Oral:  Lips and gums without lesions. Tongue and palate mucosa without lesions. Posterior oropharynx clear.  He does have elongated uvula.  Tonsils moderate size bilaterally no acute exudate. Neck: No palpable adenopathy or masses Lungs: Clear to auscultation Cardiac: Regular rate and rhythm without murmur Respiratory: Breathing comfortably  Skin: No facial/neck lesions or rash noted.  Procedures  Assessment: Chronic nasal obstruction not responsive well to nasal steroid spray. 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  His nose will be packed overnight.  He would like to schedule this on a Thursday if possible.  Also discussed uvulectomy  with him that can be done at the same time as the septoplasty and turbinate reductions He will call us back later to schedule this   Narda Bonds, MD

## 2020-05-20 ENCOUNTER — Telehealth (INDEPENDENT_AMBULATORY_CARE_PROVIDER_SITE_OTHER): Payer: Self-pay

## 2020-06-07 ENCOUNTER — Telehealth (INDEPENDENT_AMBULATORY_CARE_PROVIDER_SITE_OTHER): Payer: Self-pay

## 2020-06-09 ENCOUNTER — Telehealth (INDEPENDENT_AMBULATORY_CARE_PROVIDER_SITE_OTHER): Payer: Self-pay

## 2020-06-09 ENCOUNTER — Ambulatory Visit (INDEPENDENT_AMBULATORY_CARE_PROVIDER_SITE_OTHER): Payer: Self-pay | Admitting: Otolaryngology

## 2020-06-09 DIAGNOSIS — J342 Deviated nasal septum: Secondary | ICD-10-CM

## 2020-06-09 NOTE — H&P (Signed)
PREOPERATIVE H&P  Chief Complaint: Chronic nasal obstruction  HPI: Stephen Gay is a 34 y.o. male who presents for evaluation of chronic nasal obstruction.  He apparently fractured his nose when he was young and is always had trouble breathing through his nose.  He has tried nasal steroid sprays that helps a little bit but always has trouble breathing through his nose especially on the left side.  He has had history of strep throat and on oral examination has a very elongated uvula.  He has had problems or symptoms with this in the past and we will plan on reducing the length of the uvula following septoplasty and turbinate reductions to help improve nasal airway.  No past medical history on file. No past surgical history on file. Social History   Socioeconomic History  . Marital status: Married    Spouse name: Not on file  . Number of children: Not on file  . Years of education: Not on file  . Highest education level: Not on file  Occupational History  . Not on file  Tobacco Use  . Smoking status: Former Smoker    Packs/day: 0.02    Years: 1.00    Pack years: 0.02    Types: Cigarettes    Quit date: 10/22/2013    Years since quitting: 6.6  . Smokeless tobacco: Never Used  . Tobacco comment: Pt smoked on and off.  Vaping Use  . Vaping Use: Never used  Substance and Sexual Activity  . Alcohol use: Not Currently  . Drug use: Never  . Sexual activity: Yes  Other Topics Concern  . Not on file  Social History Narrative  . Not on file   Social Determinants of Health   Financial Resource Strain:   . Difficulty of Paying Living Expenses: Not on file  Food Insecurity:   . Worried About Programme researcher, broadcasting/film/video in the Last Year: Not on file  . Ran Out of Food in the Last Year: Not on file  Transportation Needs:   . Lack of Transportation (Medical): Not on file  . Lack of Transportation (Non-Medical): Not on file  Physical Activity:   . Days of Exercise per Week: Not on file  .  Minutes of Exercise per Session: Not on file  Stress:   . Feeling of Stress : Not on file  Social Connections:   . Frequency of Communication with Friends and Family: Not on file  . Frequency of Social Gatherings with Friends and Family: Not on file  . Attends Religious Services: Not on file  . Active Member of Clubs or Organizations: Not on file  . Attends Banker Meetings: Not on file  . Marital Status: Not on file   Family History  Problem Relation Age of Onset  . Healthy Mother   . Healthy Father    No Known Allergies Prior to Admission medications   Not on File     Positive ROS: Otherwise negative  All other systems have been reviewed and were otherwise negative with the exception of those mentioned in the HPI and as above.  Physical Exam: There were no vitals filed for this visit.  General: Alert, no acute distress Oral: Normal oral mucosa and tonsils.  Patient with a very elongated thick uvula. Nasal: Septum is deviated posteriorly to the left and anteriorly to the right.  He has large inferior turbinates with swollen mucosa.  No polyps noted. Neck: No palpable adenopathy or thyroid nodules Ear: Ear canal is  clear with normal appearing TMs Cardiovascular: Regular rate and rhythm, no murmur.  Respiratory: Clear to auscultation Neurologic: Alert and oriented x 3   Assessment/Plan: Deviated septum and turbinate hypertrophy with nasal obstruction. Elongated uvula  Plan for septoplasty with bilateral inferior turbinate reductions and partial uvulectomy.   Guerin Lashomb, MD 06/09/2020 10:04 AM  

## 2020-06-09 NOTE — H&P (View-Only) (Signed)
PREOPERATIVE H&P  Chief Complaint: Chronic nasal obstruction  HPI: Stephen Gay is a 34 y.o. male who presents for evaluation of chronic nasal obstruction.  He apparently fractured his nose when he was young and is always had trouble breathing through his nose.  He has tried nasal steroid sprays that helps a little bit but always has trouble breathing through his nose especially on the left side.  He has had history of strep throat and on oral examination has a very elongated uvula.  He has had problems or symptoms with this in the past and we will plan on reducing the length of the uvula following septoplasty and turbinate reductions to help improve nasal airway.  No past medical history on file. No past surgical history on file. Social History   Socioeconomic History  . Marital status: Married    Spouse name: Not on file  . Number of children: Not on file  . Years of education: Not on file  . Highest education level: Not on file  Occupational History  . Not on file  Tobacco Use  . Smoking status: Former Smoker    Packs/day: 0.02    Years: 1.00    Pack years: 0.02    Types: Cigarettes    Quit date: 10/22/2013    Years since quitting: 6.6  . Smokeless tobacco: Never Used  . Tobacco comment: Pt smoked on and off.  Vaping Use  . Vaping Use: Never used  Substance and Sexual Activity  . Alcohol use: Not Currently  . Drug use: Never  . Sexual activity: Yes  Other Topics Concern  . Not on file  Social History Narrative  . Not on file   Social Determinants of Health   Financial Resource Strain:   . Difficulty of Paying Living Expenses: Not on file  Food Insecurity:   . Worried About Programme researcher, broadcasting/film/video in the Last Year: Not on file  . Ran Out of Food in the Last Year: Not on file  Transportation Needs:   . Lack of Transportation (Medical): Not on file  . Lack of Transportation (Non-Medical): Not on file  Physical Activity:   . Days of Exercise per Week: Not on file  .  Minutes of Exercise per Session: Not on file  Stress:   . Feeling of Stress : Not on file  Social Connections:   . Frequency of Communication with Friends and Family: Not on file  . Frequency of Social Gatherings with Friends and Family: Not on file  . Attends Religious Services: Not on file  . Active Member of Clubs or Organizations: Not on file  . Attends Banker Meetings: Not on file  . Marital Status: Not on file   Family History  Problem Relation Age of Onset  . Healthy Mother   . Healthy Father    No Known Allergies Prior to Admission medications   Not on File     Positive ROS: Otherwise negative  All other systems have been reviewed and were otherwise negative with the exception of those mentioned in the HPI and as above.  Physical Exam: There were no vitals filed for this visit.  General: Alert, no acute distress Oral: Normal oral mucosa and tonsils.  Patient with a very elongated thick uvula. Nasal: Septum is deviated posteriorly to the left and anteriorly to the right.  He has large inferior turbinates with swollen mucosa.  No polyps noted. Neck: No palpable adenopathy or thyroid nodules Ear: Ear canal is  clear with normal appearing TMs Cardiovascular: Regular rate and rhythm, no murmur.  Respiratory: Clear to auscultation Neurologic: Alert and oriented x 3   Assessment/Plan: Deviated septum and turbinate hypertrophy with nasal obstruction. Elongated uvula  Plan for septoplasty with bilateral inferior turbinate reductions and partial uvulectomy.   Dillard Cannon, MD 06/09/2020 10:04 AM

## 2020-06-10 ENCOUNTER — Other Ambulatory Visit (HOSPITAL_COMMUNITY)
Admission: RE | Admit: 2020-06-10 | Discharge: 2020-06-10 | Disposition: A | Discharge status: Home / Self Care | Payer: No Typology Code available for payment source | Source: Ambulatory Visit | Attending: Otolaryngology | Admitting: Otolaryngology

## 2020-06-10 DIAGNOSIS — Z01812 Encounter for preprocedural laboratory examination: Secondary | ICD-10-CM | POA: Insufficient documentation

## 2020-06-10 DIAGNOSIS — Z20822 Contact with and (suspected) exposure to covid-19: Secondary | ICD-10-CM | POA: Diagnosis not present

## 2020-06-10 LAB — SARS CORONAVIRUS 2 (TAT 6-24 HRS): SARS Coronavirus 2: NEGATIVE

## 2020-06-13 ENCOUNTER — Encounter (INDEPENDENT_AMBULATORY_CARE_PROVIDER_SITE_OTHER): Payer: Self-pay

## 2020-06-13 NOTE — Progress Notes (Unsigned)
P/A # [id number] for surgery on 06/16/2020 w/DR. Narda Bonds at [facility].

## 2020-06-14 ENCOUNTER — Other Ambulatory Visit (HOSPITAL_COMMUNITY)
Admission: RE | Admit: 2020-06-14 | Discharge: 2020-06-14 | Disposition: A | Discharge status: Home / Self Care | Payer: No Typology Code available for payment source | Source: Ambulatory Visit | Attending: Otolaryngology | Admitting: Otolaryngology

## 2020-06-14 DIAGNOSIS — Z01812 Encounter for preprocedural laboratory examination: Secondary | ICD-10-CM | POA: Insufficient documentation

## 2020-06-14 DIAGNOSIS — Z20822 Contact with and (suspected) exposure to covid-19: Secondary | ICD-10-CM | POA: Diagnosis not present

## 2020-06-14 LAB — SARS CORONAVIRUS 2 (TAT 6-24 HRS): SARS Coronavirus 2: NEGATIVE

## 2020-06-15 ENCOUNTER — Encounter (HOSPITAL_COMMUNITY): Payer: Self-pay | Admitting: Otolaryngology

## 2020-06-15 NOTE — Anesthesia Preprocedure Evaluation (Addendum)
Anesthesia Evaluation  Patient identified by MRN, date of birth, ID band Patient awake    Reviewed: Allergy & Precautions, NPO status , Patient's Chart, lab work & pertinent test results  Airway Mallampati: I  TM Distance: >3 FB Neck ROM: Full    Dental no notable dental hx. (+) Teeth Intact, Dental Advisory Given   Pulmonary neg pulmonary ROS, former smoker,    Pulmonary exam normal breath sounds clear to auscultation       Cardiovascular Normal cardiovascular exam Rhythm:Regular Rate:Normal  HLD   Neuro/Psych negative neurological ROS  negative psych ROS   GI/Hepatic negative GI ROS, Neg liver ROS,   Endo/Other  negative endocrine ROS  Renal/GU negative Renal ROS  negative genitourinary   Musculoskeletal negative musculoskeletal ROS (+)   Abdominal   Peds  Hematology negative hematology ROS (+)   Anesthesia Other Findings   Reproductive/Obstetrics                            Anesthesia Physical Anesthesia Plan  ASA: II  Anesthesia Plan: General   Post-op Pain Management:    Induction: Intravenous  PONV Risk Score and Plan: 2 and Midazolam, Dexamethasone and Ondansetron  Airway Management Planned: Oral ETT  Additional Equipment:   Intra-op Plan:   Post-operative Plan: Extubation in OR  Informed Consent: I have reviewed the patients History and Physical, chart, labs and discussed the procedure including the risks, benefits and alternatives for the proposed anesthesia with the patient or authorized representative who has indicated his/her understanding and acceptance.     Dental advisory given  Plan Discussed with: CRNA  Anesthesia Plan Comments:         Anesthesia Quick Evaluation

## 2020-06-15 NOTE — Progress Notes (Signed)
Patient denies shortness of breath, fever, cough or chest pain.  PCP - Dr Edwina Barth Cardiologist - n/a  Chest x-ray - 01/06/20 (2V) EKG - n/a Stress Test - n/a ECHO - n/a Cardiac Cath - n/a  STOP now taking any Aspirin (unless otherwise instructed by your surgeon), Aleve, Naproxen, Ibuprofen, Motrin, Advil, Goody's, BC's, all herbal medications, fish oil, and all vitamins.   Coronavirus Screening Covid test on 06/14/20 was negative.  Patient verbalized understanding of instructions that were given via phone.

## 2020-06-16 ENCOUNTER — Ambulatory Visit (HOSPITAL_COMMUNITY): Payer: No Typology Code available for payment source

## 2020-06-16 ENCOUNTER — Encounter (HOSPITAL_COMMUNITY)
Admission: RE | Disposition: A | Discharge status: Home / Self Care | Payer: Self-pay | Source: Home / Self Care | Attending: Otolaryngology

## 2020-06-16 ENCOUNTER — Encounter (HOSPITAL_COMMUNITY): Payer: Self-pay | Admitting: Otolaryngology

## 2020-06-16 ENCOUNTER — Other Ambulatory Visit: Payer: Self-pay

## 2020-06-16 ENCOUNTER — Ambulatory Visit (HOSPITAL_COMMUNITY)
Admission: RE | Admit: 2020-06-16 | Discharge: 2020-06-16 | Disposition: A | Discharge status: Home / Self Care | Payer: No Typology Code available for payment source | Attending: Otolaryngology | Admitting: Otolaryngology

## 2020-06-16 DIAGNOSIS — Z87891 Personal history of nicotine dependence: Secondary | ICD-10-CM | POA: Insufficient documentation

## 2020-06-16 DIAGNOSIS — J3489 Other specified disorders of nose and nasal sinuses: Secondary | ICD-10-CM | POA: Insufficient documentation

## 2020-06-16 DIAGNOSIS — J343 Hypertrophy of nasal turbinates: Secondary | ICD-10-CM | POA: Diagnosis not present

## 2020-06-16 DIAGNOSIS — Q386 Other congenital malformations of mouth: Secondary | ICD-10-CM | POA: Diagnosis not present

## 2020-06-16 DIAGNOSIS — J342 Deviated nasal septum: Secondary | ICD-10-CM

## 2020-06-16 HISTORY — PX: UVULECTOMY: SHX2631

## 2020-06-16 HISTORY — PX: NASAL SEPTOPLASTY W/ TURBINOPLASTY: SHX2070

## 2020-06-16 HISTORY — DX: Hyperlipidemia, unspecified: E78.5

## 2020-06-16 HISTORY — DX: Deviated nasal septum: J34.2

## 2020-06-16 LAB — CBC
HCT: 43.1 % (ref 39.0–52.0)
Hemoglobin: 14 g/dL (ref 13.0–17.0)
MCH: 28 pg (ref 26.0–34.0)
MCHC: 32.5 g/dL (ref 30.0–36.0)
MCV: 86.2 fL (ref 80.0–100.0)
Platelets: 218 10*3/uL (ref 150–400)
RBC: 5 MIL/uL (ref 4.22–5.81)
RDW: 12.4 % (ref 11.5–15.5)
WBC: 9.8 10*3/uL (ref 4.0–10.5)
nRBC: 0 % (ref 0.0–0.2)

## 2020-06-16 SURGERY — SEPTOPLASTY, NOSE, WITH NASAL TURBINATE REDUCTION
Anesthesia: General | Site: Nose

## 2020-06-16 MED ORDER — LIDOCAINE 2% (20 MG/ML) 5 ML SYRINGE
INTRAMUSCULAR | Status: AC
  Filled 2020-06-16: qty 5

## 2020-06-16 MED ORDER — BACITRACIN ZINC 500 UNIT/GM EX OINT
TOPICAL_OINTMENT | CUTANEOUS | Status: AC
  Filled 2020-06-16: qty 28.35

## 2020-06-16 MED ORDER — LIDOCAINE-EPINEPHRINE 1 %-1:100000 IJ SOLN
INTRAMUSCULAR | Status: AC
  Filled 2020-06-16: qty 1

## 2020-06-16 MED ORDER — CHLORHEXIDINE GLUCONATE CLOTH 2 % EX PADS: 6.0000 | MEDICATED_PAD | Freq: Once | CUTANEOUS | Status: DC

## 2020-06-16 MED ORDER — FENTANYL CITRATE (PF) 100 MCG/2ML IJ SOLN
INTRAMUSCULAR | Status: AC
Start: 2020-06-16 — End: 2020-06-16
  Administered 2020-06-16: 25 ug via INTRAVENOUS
  Filled 2020-06-16: qty 2

## 2020-06-16 MED ORDER — SODIUM CHLORIDE 0.9 % IR SOLN
Status: DC | PRN
  Administered 2020-06-16: 1000 mL

## 2020-06-16 MED ORDER — OXYMETAZOLINE HCL 0.05 % NA SOLN
NASAL | Status: DC | PRN
  Administered 2020-06-16: 1

## 2020-06-16 MED ORDER — CHLORHEXIDINE GLUCONATE 0.12 % MT SOLN
15.0000 mL | Freq: Once | OROMUCOSAL | Status: AC
  Administered 2020-06-16: 15 mL via OROMUCOSAL
  Filled 2020-06-16: qty 15

## 2020-06-16 MED ORDER — MUPIROCIN 2 % EX OINT
TOPICAL_OINTMENT | CUTANEOUS | Status: AC
  Filled 2020-06-16: qty 22

## 2020-06-16 MED ORDER — ORAL CARE MOUTH RINSE: 15.0000 mL | Freq: Once | OROMUCOSAL | Status: AC

## 2020-06-16 MED ORDER — MIDAZOLAM HCL 2 MG/2ML IJ SOLN
INTRAMUSCULAR | Status: DC | PRN
  Administered 2020-06-16: 2 mg via INTRAVENOUS

## 2020-06-16 MED ORDER — FENTANYL CITRATE (PF) 100 MCG/2ML IJ SOLN: 25.0000 ug | INTRAMUSCULAR | Status: DC | PRN

## 2020-06-16 MED ORDER — 0.9 % SODIUM CHLORIDE (POUR BTL) OPTIME
TOPICAL | Status: DC | PRN
  Administered 2020-06-16: 1000 mL

## 2020-06-16 MED ORDER — DEXAMETHASONE SODIUM PHOSPHATE 10 MG/ML IJ SOLN
INTRAMUSCULAR | Status: AC
  Filled 2020-06-16: qty 1

## 2020-06-16 MED ORDER — LIDOCAINE-EPINEPHRINE 1 %-1:100000 IJ SOLN
INTRAMUSCULAR | Status: DC | PRN
  Administered 2020-06-16: 10 mL

## 2020-06-16 MED ORDER — HYDROCODONE-ACETAMINOPHEN 5-325 MG PO TABS: 1.0000 | ORAL_TABLET | Freq: Once | ORAL | Status: AC

## 2020-06-16 MED ORDER — PHENYLEPHRINE 40 MCG/ML (10ML) SYRINGE FOR IV PUSH (FOR BLOOD PRESSURE SUPPORT)
PREFILLED_SYRINGE | INTRAVENOUS | Status: DC | PRN
  Administered 2020-06-16: 80 ug via INTRAVENOUS

## 2020-06-16 MED ORDER — ACETAMINOPHEN 500 MG PO TABS
1000.0000 mg | ORAL_TABLET | Freq: Once | ORAL | Status: AC
  Administered 2020-06-16: 1000 mg via ORAL
  Filled 2020-06-16: qty 2

## 2020-06-16 MED ORDER — LIDOCAINE 2% (20 MG/ML) 5 ML SYRINGE
INTRAMUSCULAR | Status: DC | PRN
  Administered 2020-06-16: 80 mg via INTRAVENOUS

## 2020-06-16 MED ORDER — LACTATED RINGERS IV SOLN: INTRAVENOUS | Status: DC

## 2020-06-16 MED ORDER — PROPOFOL 10 MG/ML IV BOLUS
INTRAVENOUS | Status: AC
  Filled 2020-06-16: qty 20

## 2020-06-16 MED ORDER — PROPOFOL 10 MG/ML IV BOLUS
INTRAVENOUS | Status: DC | PRN
  Administered 2020-06-16: 200 mg via INTRAVENOUS

## 2020-06-16 MED ORDER — ROCURONIUM BROMIDE 10 MG/ML (PF) SYRINGE
PREFILLED_SYRINGE | INTRAVENOUS | Status: DC | PRN
  Administered 2020-06-16: 20 mg via INTRAVENOUS
  Administered 2020-06-16: 80 mg via INTRAVENOUS

## 2020-06-16 MED ORDER — HYDROCODONE-ACETAMINOPHEN 5-325 MG PO TABS: 1.0000 | ORAL_TABLET | Freq: Four times a day (QID) | ORAL | 0 refills | Status: AC | PRN

## 2020-06-16 MED ORDER — OXYMETAZOLINE HCL 0.05 % NA SOLN
NASAL | Status: AC
  Filled 2020-06-16: qty 30

## 2020-06-16 MED ORDER — HYDROCODONE-ACETAMINOPHEN 5-325 MG PO TABS
ORAL_TABLET | ORAL | Status: AC
  Administered 2020-06-16: 2 via ORAL
  Filled 2020-06-16: qty 2

## 2020-06-16 MED ORDER — CEFAZOLIN SODIUM-DEXTROSE 2-4 GM/100ML-% IV SOLN
2.0000 g | INTRAVENOUS | Status: AC
  Administered 2020-06-16: 2 g via INTRAVENOUS
  Filled 2020-06-16: qty 100

## 2020-06-16 MED ORDER — MIDAZOLAM HCL 2 MG/2ML IJ SOLN
INTRAMUSCULAR | Status: AC
  Filled 2020-06-16: qty 2

## 2020-06-16 MED ORDER — SALINE FLUSH 0.9 % IV SOLN
INTRAVENOUS | Status: DC | PRN
  Administered 2020-06-16: 10 mL via SURGICAL_CAVITY

## 2020-06-16 MED ORDER — DEXAMETHASONE SODIUM PHOSPHATE 10 MG/ML IJ SOLN
INTRAMUSCULAR | Status: DC | PRN
  Administered 2020-06-16: 10 mg via INTRAVENOUS

## 2020-06-16 MED ORDER — FENTANYL CITRATE (PF) 250 MCG/5ML IJ SOLN
INTRAMUSCULAR | Status: AC
  Filled 2020-06-16: qty 5

## 2020-06-16 MED ORDER — PHENYLEPHRINE HCL-NACL 10-0.9 MG/250ML-% IV SOLN
INTRAVENOUS | Status: DC | PRN
  Administered 2020-06-16: 20 ug/min via INTRAVENOUS

## 2020-06-16 MED ORDER — FENTANYL CITRATE (PF) 250 MCG/5ML IJ SOLN
INTRAMUSCULAR | Status: DC | PRN
Start: 2020-06-16 — End: 2020-06-16
  Administered 2020-06-16: 100 ug via INTRAVENOUS
  Administered 2020-06-16: 50 ug via INTRAVENOUS

## 2020-06-16 MED ORDER — ONDANSETRON HCL 4 MG/2ML IJ SOLN
INTRAMUSCULAR | Status: DC | PRN
  Administered 2020-06-16: 4 mg via INTRAVENOUS

## 2020-06-16 MED ORDER — CEPHALEXIN 500 MG PO CAPS: 500.0000 mg | ORAL_CAPSULE | Freq: Two times a day (BID) | ORAL | 0 refills | Status: AC

## 2020-06-16 MED ORDER — SUGAMMADEX SODIUM 200 MG/2ML IV SOLN
INTRAVENOUS | Status: DC | PRN
  Administered 2020-06-16: 220 mg via INTRAVENOUS
  Administered 2020-06-16: 40 mg via INTRAVENOUS

## 2020-06-16 MED ORDER — ONDANSETRON HCL 4 MG/2ML IJ SOLN
INTRAMUSCULAR | Status: AC
  Filled 2020-06-16: qty 2

## 2020-06-16 SURGICAL SUPPLY — 48 items
BLADE INF TURB ROT M4 2 5PK (BLADE) ×3 IMPLANT
BLADE SURG 15 STRL LF DISP TIS (BLADE) ×2 IMPLANT
BLADE SURG 15 STRL SS (BLADE) ×3
BLADE TRICUT ROTATE M4 4 5PK (BLADE) ×3 IMPLANT
CANISTER SUCT 3000ML PPV (MISCELLANEOUS) ×6 IMPLANT
CLEANER TIP ELECTROSURG 2X2 (MISCELLANEOUS) ×3 IMPLANT
COAGULATOR SUCT 8FR VV (MISCELLANEOUS) ×3 IMPLANT
COVER WAND RF STERILE (DRAPES) ×3 IMPLANT
DECANTER SPIKE VIAL GLASS SM (MISCELLANEOUS) ×3 IMPLANT
DRAPE HALF SHEET 40X57 (DRAPES) IMPLANT
DRSG NASOPORE 8CM (GAUZE/BANDAGES/DRESSINGS) ×3 IMPLANT
DRSG TELFA 3X8 NADH (GAUZE/BANDAGES/DRESSINGS) ×3 IMPLANT
ELECT COATED BLADE 2.86 ST (ELECTRODE) ×3 IMPLANT
ELECT REM PT RETURN 9FT ADLT (ELECTROSURGICAL) ×3
ELECTRODE REM PT RTRN 9FT ADLT (ELECTROSURGICAL) ×2 IMPLANT
GAUZE SPONGE 2X2 8PLY STRL LF (GAUZE/BANDAGES/DRESSINGS) ×2 IMPLANT
GLOVE BIOGEL PI IND STRL 6.5 (GLOVE) ×2 IMPLANT
GLOVE BIOGEL PI INDICATOR 6.5 (GLOVE) ×1
GLOVE SS BIOGEL STRL SZ 7.5 (GLOVE) ×2 IMPLANT
GLOVE SUPERSENSE BIOGEL SZ 7.5 (GLOVE) ×1
GOWN STRL REUS W/ TWL LRG LVL3 (GOWN DISPOSABLE) ×2 IMPLANT
GOWN STRL REUS W/ TWL XL LVL3 (GOWN DISPOSABLE) ×2 IMPLANT
GOWN STRL REUS W/TWL LRG LVL3 (GOWN DISPOSABLE) ×3
GOWN STRL REUS W/TWL XL LVL3 (GOWN DISPOSABLE) ×3
KIT BASIN OR (CUSTOM PROCEDURE TRAY) ×3 IMPLANT
KIT TURNOVER KIT B (KITS) ×3 IMPLANT
NEEDLE HYPO 25GX1X1/2 BEV (NEEDLE) IMPLANT
NEEDLE HYPO 25X1 1.5 SAFETY (NEEDLE) ×3 IMPLANT
NEEDLE PRECISIONGLIDE 27X1.5 (NEEDLE) ×3 IMPLANT
NS IRRIG 1000ML POUR BTL (IV SOLUTION) ×3 IMPLANT
PAD ARMBOARD 7.5X6 YLW CONV (MISCELLANEOUS) ×6 IMPLANT
PATTIES SURGICAL .5 X3 (DISPOSABLE) ×3 IMPLANT
PENCIL FOOT CONTROL (ELECTRODE) ×3 IMPLANT
SOL ANTI FOG 6CC (MISCELLANEOUS) ×2 IMPLANT
SOLUTION ANTI FOG 6CC (MISCELLANEOUS) ×1
SPECIMEN JAR SMALL (MISCELLANEOUS) ×3 IMPLANT
SPONGE GAUZE 2X2 STER 10/PKG (GAUZE/BANDAGES/DRESSINGS) ×1
SPONGE INTESTINAL PEANUT (DISPOSABLE) ×3 IMPLANT
SPONGE TONSIL TAPE 1 RFD (DISPOSABLE) IMPLANT
STRIP CLOSURE SKIN 1/2X4 (GAUZE/BANDAGES/DRESSINGS) IMPLANT
SUT CHROMIC 3 0 PS 2 (SUTURE) ×3 IMPLANT
SUT SILK 2 0 PERMA HAND 18 BK (SUTURE) ×3 IMPLANT
SUT VIC AB 3-0 SH 27 (SUTURE) ×9
SUT VIC AB 3-0 SH 27XBRD (SUTURE) ×6 IMPLANT
TOWEL GREEN STERILE FF (TOWEL DISPOSABLE) ×3 IMPLANT
TRAY ENT MC OR (CUSTOM PROCEDURE TRAY) ×3 IMPLANT
TUBE CONNECTING 12X1/4 (SUCTIONS) ×3 IMPLANT
WATER STERILE IRR 1000ML POUR (IV SOLUTION) ×3 IMPLANT

## 2020-06-16 NOTE — Discharge Instructions (Signed)
Elevate head of bed and apply cool compress to nose to reduce bleeding and swelling Diet as tolerated and use throat lozenges or spray for sore throat  Keflex 500 mg bid for the next week Tylenol, ibuprofen or hydrocodone tabs 1-2 every 6 hrs prn worse pain  Drink plenty of liquids Return to see Dr Ezzard Standing at his office tomorrow at 4:30 Call if you have any problems or questions   [phone]

## 2020-06-16 NOTE — Transfer of Care (Signed)
Immediate Anesthesia Transfer of Care Note  Patient: Stephen Gay  Procedure(s) Performed: NASAL SEPTOPLASTY WITH TURBINATE REDUCTION (Bilateral Nose) UVULECTOMY (N/A Mouth)  Patient Location: PACU  Anesthesia Type:General  Level of Consciousness: drowsy  Airway & Oxygen Therapy: Patient Spontanous Breathing and Patient connected to face mask oxygen  Post-op Assessment: Report given to RN and Post -op Vital signs reviewed and stable  Post vital signs: Reviewed and stable  Last Vitals:  Vitals Value Taken Time  BP 131/98 06/16/20 0949  Temp    Pulse 81 06/16/20 0950  Resp 17 06/16/20 0950  SpO2 100 % 06/16/20 0950  Vitals shown include unvalidated device data.  Last Pain:  Vitals:   06/16/20 0615  TempSrc:   PainSc: 0-No pain         Complications: No complications documented.

## 2020-06-16 NOTE — Interval H&P Note (Signed)
History and Physical Interval Note:  06/16/2020 7:34 AM  Stephen Gay  has presented today for surgery, with the diagnosis of DEVIATED SEPTUM,NASAL HYPERTROPHY,ELONGATED UVULA.  The various methods of treatment have been discussed with the patient and family. After consideration of risks, benefits and other options for treatment, the patient has consented to  Procedure(s): NASAL SEPTOPLASTY WITH TURBINATE REDUCTION (Bilateral) UVULECTOMY (N/A) as a surgical intervention.  The patient's history has been reviewed, patient examined, no change in status, stable for surgery.  I have reviewed the patient's chart and labs.  Questions were answered to the patient's satisfaction.     Dillard Cannon

## 2020-06-16 NOTE — Anesthesia Postprocedure Evaluation (Signed)
Anesthesia Post Note  Patient: Stephen Gay  Procedure(s) Performed: NASAL SEPTOPLASTY WITH TURBINATE REDUCTION (Bilateral Nose) UVULECTOMY (N/A Mouth)     Patient location during evaluation: PACU Anesthesia Type: General Level of consciousness: awake and alert Pain management: pain level controlled Vital Signs Assessment: post-procedure vital signs reviewed and stable Respiratory status: spontaneous breathing, nonlabored ventilation, respiratory function stable and patient connected to nasal cannula oxygen Cardiovascular status: blood pressure returned to baseline and stable Postop Assessment: no apparent nausea or vomiting Anesthetic complications: no   No complications documented.  Last Vitals:  Vitals:   06/16/20 1015 06/16/20 1030  BP: (!) 132/99 (!) 125/91  Pulse: 83 72  Resp: 14 12  Temp:  (!) 36.1 C  SpO2: 98% 95%    Last Pain:  Vitals:   06/16/20 1021  TempSrc:   PainSc: 8                  Stephen Gay

## 2020-06-16 NOTE — Brief Op Note (Signed)
06/16/2020  9:39 AM  PATIENT:  Stephen Gay  34 y.o. male  PRE-OPERATIVE DIAGNOSIS:  DEVIATED SEPTUM,NASAL HYPERTROPHY,ELONGATED UVULA  POST-OPERATIVE DIAGNOSIS:  DEVIATED SEPTUM,NASAL HYPERTROPHY,ELONGATED UVULA  PROCEDURE:  Procedure(s): NASAL SEPTOPLASTY WITH TURBINATE REDUCTION (Bilateral) UVULECTOMY (N/A)  SURGEON:  Surgeon(s) and Role:    * Drema Halon, MD - Primary  PHYSICIAN ASSISTANT:   ASSISTANTS: none   ANESTHESIA:   general  EBL:  25 cc   BLOOD ADMINISTERED:none  DRAINS: none   LOCAL MEDICATIONS USED:  XYLOCAINE with epi 10 cc  SPECIMEN:  No Specimen  DISPOSITION OF SPECIMEN:  N/A  COUNTS:  YES  TOURNIQUET:  * No tourniquets in log *  DICTATION: .Other Dictation: Dictation Number [id number]  PLAN OF CARE: Discharge to home after PACU  PATIENT DISPOSITION:  PACU - hemodynamically stable.   Delay start of Pharmacological VTE agent (>24hrs) due to surgical blood loss or risk of bleeding: yes

## 2020-06-16 NOTE — Op Note (Signed)
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  Elongated uvula.  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  Elongated uvula.  OPERATION PERFORMED:  Septoplasty with bilateral inferior turbinate reductions utilizing a Medtronic turbinate blade.  Uvulectomy.  SURGEON:  Dillard Cannon, MD  ANESTHESIA:  General endotracheal.  ESTIMATED BLOOD LOSS:  Less than 30 mL.  COMPLICATIONS:  None.  BRIEF CLINICAL NOTE:  Stephen Gay is a 34 year old gentleman who has always had trouble breathing through his nose for a number of years.  He apparently had a nasal fracture when he was young.  He has tried several nasal steroid sprays without adequate relief  and is taken to the operating room at this time for septoplasty and turbinate reductions.  He complains of more trouble breathing on the left side.  On exam, anteriorly he has cartilaginous septum bowed more to the right side, but more posteriorly he has  large septal spurs more on the left side, protruding to the left inferior turbinate.  He also has a very elongated uvula that he states this has caused him problems and he has actually bitten it and it caused him to bleed.  On exam, has a very thick,  elongated uvula.  He also snores.  He is taken to the operating room at this time for septoplasty and turbinate reductions along with uvulectomy.  DESCRIPTION OF PROCEDURE:  After adequate endotracheal anesthesia, the patient received 2 grams of Ancef IV preoperatively.  Nose was prepped with Betadine solution and draped in sterile towels.  Nose was then further examined and septum and turbinates  were injected with approximately 10 mL of  Xylocaine with epinephrine for hemostasis.  Afrin was used to decongest the nose.  On exam, the patient had a cartilaginous septum bowed more to the right side, but had a large septal spur more on the left side  anteriorly with cartilage protruded off of the maxillary crest as well as a bony septal spur posteriorly on the left side that protruded into the left inferior turbinate.  Hemitransfixion incision was made along the caudal edge of the septum on the right  side.  Mucoperichondrial mucoperiosteal flaps were elevated posteriorly.  Then, inferior mucoperiosteal flap was elevated off of the anterior cartilaginous septum where it bowed into the left airway off the maxillary crest and this cartilaginous spur  was removed.  In addition, because of the bowing of the septum to the right a 1 mm strip of cartilaginous septum was removed to allow the septum to return much better to midline.  In addition, more posteriorly, after elevating mucoperiosteal flaps,  elevated either side of the large septal spur posteriorly, this was removed.  This completed the septoplasty portion of the procedure.  Next, inferior turbinate reductions were performed with Medtronic turbinate blade.  In addition, on the left side  where he had more trouble breathing a small sliver of inferior turbinate was amputated from the posterior turbinate and suction cautery was used for hemostasis.  The turbinates were outfractured bilaterally.  Hemostasis was obtained with the cautery.   The septoplasty incision was closed with interrupted 3-0 chromic sutures through the hemitransfixion incision and the septum was basted with a 3-0 chromic suture.  Nose was then packed with Telfa soaked in bacitracin ointment placed on either side  of the  septum.  Next, a mouth gag was used to expose the oropharynx and the elongated uvula was amputated at its base of attachment to the soft palate.  Hemostasis was obtained with the cautery.  This completed the  procedure.  The patient had minimal bleeding  from uvulectomy.  The patient was subsequently awoken from anesthesia and transferred to recovery room, postoperatively doing well.  DISPOSITION:  The patient will be discharged home later this morning on Keflex 500 mg b.i.d. for 1 week.  Tylenol, ibuprofen or hydrocodone p.r.n. pain.  He will follow up in my office tomorrow to have his nasal packs removed.  CN/NUANCE  D:06/16/2020 T:06/16/2020 JOB:[id number]

## 2020-06-16 NOTE — Anesthesia Procedure Notes (Signed)
Procedure Name: Intubation Date/Time: 06/16/2020 7:48 AM Performed by: Bryson Corona, CRNA Pre-anesthesia Checklist: Patient identified, Emergency Drugs available, Suction available and Patient being monitored Patient Re-evaluated:Patient Re-evaluated prior to induction Oxygen Delivery Method: Circle System Utilized Preoxygenation: Pre-oxygenation with 100% oxygen Induction Type: IV induction Ventilation: Oral airway inserted - appropriate to patient size Laryngoscope Size: Mac and 4 Grade View: Grade I Tube type: Oral Tube size: 7.0 mm Number of attempts: 1 Airway Equipment and Method: Stylet and Oral airway Placement Confirmation: ETT inserted through vocal cords under direct vision,  positive ETCO2 and breath sounds checked- equal and bilateral Secured at: 22 cm Tube secured with: Tape Dental Injury: Teeth and Oropharynx as per pre-operative assessment

## 2020-06-17 ENCOUNTER — Encounter (HOSPITAL_COMMUNITY): Payer: Self-pay | Admitting: Otolaryngology

## 2020-06-17 ENCOUNTER — Ambulatory Visit (INDEPENDENT_AMBULATORY_CARE_PROVIDER_SITE_OTHER): Payer: No Typology Code available for payment source | Admitting: Otolaryngology

## 2020-06-17 VITALS — Temp 97.9°F

## 2020-06-17 DIAGNOSIS — Z4889 Encounter for other specified surgical aftercare: Secondary | ICD-10-CM

## 2020-06-17 NOTE — Progress Notes (Signed)
HPI: Stephen Gay is a 34 y.o. male who presents 1 days s/p septoplasty and turbinate reductions and uvulectomy to have his nasal packs removed..   Past Medical History:  Diagnosis Date  . Deviated nasal septum   . HLD (hyperlipidemia)    diet controlled, no meds   Past Surgical History:  Procedure Laterality Date  . NASAL SEPTOPLASTY W/ TURBINOPLASTY Bilateral 06/16/2020   Procedure: NASAL SEPTOPLASTY WITH TURBINATE REDUCTION;  Surgeon: Drema Halon, MD;  Location: [facility] OR;  Service: ENT;  Laterality: Bilateral;  . UVULECTOMY N/A 06/16/2020   Procedure: UVULECTOMY;  Surgeon: Drema Halon, MD;  Location: [facility] OR;  Service: ENT;  Laterality: N/A;   Social History   Socioeconomic History  . Marital status: Married    Spouse name: Not on file  . Number of children: Not on file  . Years of education: Not on file  . Highest education level: Not on file  Occupational History  . Not on file  Tobacco Use  . Smoking status: Never Smoker  . Smokeless tobacco: Never Used  . Tobacco comment: Pt smoked on and off.  Vaping Use  . Vaping Use: Never used  Substance and Sexual Activity  . Alcohol use: Not Currently    Comment: occasional beer  . Drug use: Never  . Sexual activity: Yes  Other Topics Concern  . Not on file  Social History Narrative  . Not on file   Social Determinants of Health   Financial Resource Strain:   . Difficulty of Paying Living Expenses: Not on file  Food Insecurity:   . Worried About Programme researcher, broadcasting/film/video in the Last Year: Not on file  . Ran Out of Food in the Last Year: Not on file  Transportation Needs:   . Lack of Transportation (Medical): Not on file  . Lack of Transportation (Non-Medical): Not on file  Physical Activity:   . Days of Exercise per Week: Not on file  . Minutes of Exercise per Session: Not on file  Stress:   . Feeling of Stress : Not on file  Social Connections:   . Frequency of Communication with Friends and Family: Not  on file  . Frequency of Social Gatherings with Friends and Family: Not on file  . Attends Religious Services: Not on file  . Active Member of Clubs or Organizations: Not on file  . Attends Banker Meetings: Not on file  . Marital Status: Not on file   Family History  Problem Relation Age of Onset  . Healthy Mother   . Healthy Father    No Known Allergies Prior to Admission medications   Medication Sig Start Date End Date Taking? Authorizing Provider  cephALEXin (KEFLEX) 500 MG capsule Take 1 capsule (500 mg total) by mouth 2 (two) times daily. 06/16/20  Yes Drema Halon, MD  HYDROcodone-acetaminophen (NORCO) 5-325 MG tablet Take 1-2 tablets by mouth every 6 (six) hours as needed for up to 3 days for moderate pain. 06/16/20 06/19/20 Yes Drema Halon, MD     Physical Exam: Nasal packs were removed in the office with minimal bleeding.   Assessment: S/p septoplasty, turbinate reduction and uvulectomy  Plan: Gave him a sample of saline nasal irrigation NeilMed and recommended use of the saline irrigation once or twice a day.  He will follow-up in 1 week for recheck and cleaning and to recheck uvulectomy.   Narda Bonds, MD

## 2020-06-24 ENCOUNTER — Ambulatory Visit (INDEPENDENT_AMBULATORY_CARE_PROVIDER_SITE_OTHER): Payer: No Typology Code available for payment source | Admitting: Otolaryngology

## 2020-06-24 ENCOUNTER — Other Ambulatory Visit: Payer: Self-pay

## 2020-06-24 VITALS — Temp 97.3°F

## 2020-06-24 DIAGNOSIS — Z4889 Encounter for other specified surgical aftercare: Secondary | ICD-10-CM

## 2020-06-24 NOTE — Progress Notes (Signed)
HPI: Stephen Gay is a 34 y.o. male who presents 8 days s/p septoplasty and turbinate reductions and uvulectomy.  He is doing much better with resolution of the sore throat.  He has had no bleeding from his nose.Marland Kitchen   Past Medical History:  Diagnosis Date  . Deviated nasal septum   . HLD (hyperlipidemia)    diet controlled, no meds   Past Surgical History:  Procedure Laterality Date  . NASAL SEPTOPLASTY W/ TURBINOPLASTY Bilateral 06/16/2020   Procedure: NASAL SEPTOPLASTY WITH TURBINATE REDUCTION;  Surgeon: Drema Halon, MD;  Location: [facility] OR;  Service: ENT;  Laterality: Bilateral;  . UVULECTOMY N/A 06/16/2020   Procedure: UVULECTOMY;  Surgeon: Drema Halon, MD;  Location: [facility] OR;  Service: ENT;  Laterality: N/A;   Social History   Socioeconomic History  . Marital status: Married    Spouse name: Not on file  . Number of children: Not on file  . Years of education: Not on file  . Highest education level: Not on file  Occupational History  . Not on file  Tobacco Use  . Smoking status: Never Smoker  . Smokeless tobacco: Never Used  . Tobacco comment: Pt smoked on and off.  Vaping Use  . Vaping Use: Never used  Substance and Sexual Activity  . Alcohol use: Not Currently    Comment: occasional beer  . Drug use: Never  . Sexual activity: Yes  Other Topics Concern  . Not on file  Social History Narrative  . Not on file   Social Determinants of Health   Financial Resource Strain:   . Difficulty of Paying Living Expenses: Not on file  Food Insecurity:   . Worried About Programme researcher, broadcasting/film/video in the Last Year: Not on file  . Ran Out of Food in the Last Year: Not on file  Transportation Needs:   . Lack of Transportation (Medical): Not on file  . Lack of Transportation (Non-Medical): Not on file  Physical Activity:   . Days of Exercise per Week: Not on file  . Minutes of Exercise per Session: Not on file  Stress:   . Feeling of Stress : Not on file  Social  Connections:   . Frequency of Communication with Friends and Family: Not on file  . Frequency of Social Gatherings with Friends and Family: Not on file  . Attends Religious Services: Not on file  . Active Member of Clubs or Organizations: Not on file  . Attends Banker Meetings: Not on file  . Marital Status: Not on file   Family History  Problem Relation Age of Onset  . Healthy Mother   . Healthy Father    No Known Allergies Prior to Admission medications   Medication Sig Start Date End Date Taking? Authorizing Provider  cephALEXin (KEFLEX) 500 MG capsule Take 1 capsule (500 mg total) by mouth 2 (two) times daily. 06/16/20   Drema Halon, MD     Physical Exam: He has moderate crusting along the inferior turbinates which was partially removed in the office today.  Uvulectomy is healing nicely.   Assessment: S/p septoplasty, turbinate reductions and uvulectomy  Plan: He will follow-up in 2 to 3 weeks for recheck.   Narda Bonds, MD

## 2020-07-15 ENCOUNTER — Other Ambulatory Visit: Payer: Self-pay

## 2020-07-15 ENCOUNTER — Ambulatory Visit (INDEPENDENT_AMBULATORY_CARE_PROVIDER_SITE_OTHER): Payer: No Typology Code available for payment source | Admitting: Otolaryngology

## 2020-07-15 VITALS — Temp 97.3°F

## 2020-07-15 DIAGNOSIS — Z4889 Encounter for other specified surgical aftercare: Secondary | ICD-10-CM

## 2020-07-15 NOTE — Progress Notes (Signed)
HPI: Stephen Gay is a 34 y.o. male who presents 30 days s/p septoplasty, turbinate reductions and uvulectomy.  He is doing much better today breathing a lot easier and his snoring is much better.Marland Kitchen  He did have some bleeding from the left side of his nose about a week or 2 ago but has not had any further bleeding.  Past Medical History:  Diagnosis Date  . Deviated nasal septum   . HLD (hyperlipidemia)    diet controlled, no meds   Past Surgical History:  Procedure Laterality Date  . NASAL SEPTOPLASTY W/ TURBINOPLASTY Bilateral 06/16/2020   Procedure: NASAL SEPTOPLASTY WITH TURBINATE REDUCTION;  Surgeon: Drema Halon, MD;  Location: [facility] OR;  Service: ENT;  Laterality: Bilateral;  . UVULECTOMY N/A 06/16/2020   Procedure: UVULECTOMY;  Surgeon: Drema Halon, MD;  Location: [facility] OR;  Service: ENT;  Laterality: N/A;   Social History   Socioeconomic History  . Marital status: Married    Spouse name: Not on file  . Number of children: Not on file  . Years of education: Not on file  . Highest education level: Not on file  Occupational History  . Not on file  Tobacco Use  . Smoking status: Never Smoker  . Smokeless tobacco: Never Used  . Tobacco comment: Pt smoked on and off.  Vaping Use  . Vaping Use: Never used  Substance and Sexual Activity  . Alcohol use: Not Currently    Comment: occasional beer  . Drug use: Never  . Sexual activity: Yes  Other Topics Concern  . Not on file  Social History Narrative  . Not on file   Social Determinants of Health   Financial Resource Strain:   . Difficulty of Paying Living Expenses: Not on file  Food Insecurity:   . Worried About Programme researcher, broadcasting/film/video in the Last Year: Not on file  . Ran Out of Food in the Last Year: Not on file  Transportation Needs:   . Lack of Transportation (Medical): Not on file  . Lack of Transportation (Non-Medical): Not on file  Physical Activity:   . Days of Exercise per Week: Not on file  . Minutes  of Exercise per Session: Not on file  Stress:   . Feeling of Stress : Not on file  Social Connections:   . Frequency of Communication with Friends and Family: Not on file  . Frequency of Social Gatherings with Friends and Family: Not on file  . Attends Religious Services: Not on file  . Active Member of Clubs or Organizations: Not on file  . Attends Banker Meetings: Not on file  . Marital Status: Not on file   Family History  Problem Relation Age of Onset  . Healthy Mother   . Healthy Father    No Known Allergies Prior to Admission medications   Medication Sig Start Date End Date Taking? Authorizing Provider  cephALEXin (KEFLEX) 500 MG capsule Take 1 capsule (500 mg total) by mouth 2 (two) times daily. 06/16/20   Drema Halon, MD     Physical Exam: He has little bit of crusting along the inferior turbinate on the right side that was removed in the office.  Left nasal passageway was clear. Oral exam reveals well-healed uvulectomy.   Assessment: S/p septoplasty and turbinate reductions and uvulectomy performed on 06/16/2020.  Plan: He is doing well with no complaints.  He is pleased with the results of the surgery. He will follow-up as needed  Radene Journey, MD

## 2021-09-12 ENCOUNTER — Ambulatory Visit (INDEPENDENT_AMBULATORY_CARE_PROVIDER_SITE_OTHER): Payer: No Typology Code available for payment source | Admitting: Emergency Medicine

## 2021-09-12 ENCOUNTER — Other Ambulatory Visit: Payer: Self-pay

## 2021-09-12 ENCOUNTER — Ambulatory Visit (INDEPENDENT_AMBULATORY_CARE_PROVIDER_SITE_OTHER): Payer: No Typology Code available for payment source

## 2021-09-12 ENCOUNTER — Encounter: Payer: Self-pay | Admitting: Emergency Medicine

## 2021-09-12 VITALS — BP 122/78 | HR 102 | Ht 71.0 in | Wt 236.0 lb

## 2021-09-12 DIAGNOSIS — Z1329 Encounter for screening for other suspected endocrine disorder: Secondary | ICD-10-CM

## 2021-09-12 DIAGNOSIS — Z130 Encounter for screening for diseases of the blood and blood-forming organs and certain disorders involving the immune mechanism: Secondary | ICD-10-CM | POA: Diagnosis not present

## 2021-09-12 DIAGNOSIS — T5991XA Toxic effect of unspecified gases, fumes and vapors, accidental (unintentional), initial encounter: Secondary | ICD-10-CM | POA: Diagnosis not present

## 2021-09-12 DIAGNOSIS — Z0000 Encounter for general adult medical examination without abnormal findings: Secondary | ICD-10-CM | POA: Diagnosis not present

## 2021-09-12 DIAGNOSIS — Z13220 Encounter for screening for lipoid disorders: Secondary | ICD-10-CM | POA: Diagnosis not present

## 2021-09-12 DIAGNOSIS — Z13228 Encounter for screening for other metabolic disorders: Secondary | ICD-10-CM

## 2021-09-12 DIAGNOSIS — Z1159 Encounter for screening for other viral diseases: Secondary | ICD-10-CM

## 2021-09-12 LAB — COMPREHENSIVE METABOLIC PANEL
ALT: 18 U/L (ref 0–53)
AST: 17 U/L (ref 0–37)
Albumin: 4.6 g/dL (ref 3.5–5.2)
Alkaline Phosphatase: 70 U/L (ref 39–117)
BUN: 12 mg/dL (ref 6–23)
CO2: 31 mEq/L (ref 19–32)
Calcium: 9.1 mg/dL (ref 8.4–10.5)
Chloride: 101 mEq/L (ref 96–112)
Creatinine, Ser: 1.01 mg/dL (ref 0.40–1.50)
GFR: 97 mL/min (ref >60.00)
Glucose, Bld: 83 mg/dL (ref 70–99)
Potassium: 3.7 mEq/L (ref 3.5–5.1)
Sodium: 138 mEq/L (ref 135–145)
Total Bilirubin: 0.4 mg/dL (ref 0.2–1.2)
Total Protein: 7 g/dL (ref 6.0–8.3)

## 2021-09-12 LAB — CBC WITH DIFFERENTIAL/PLATELET
Basophils Absolute: 0 10*3/uL (ref 0.0–0.1)
Basophils Relative: 0.7 % (ref 0.0–3.0)
Eosinophils Absolute: 0.3 10*3/uL (ref 0.0–0.7)
Eosinophils Relative: 4.5 % (ref 0.0–5.0)
HCT: 41.5 % (ref 39.0–52.0)
Hemoglobin: 14.1 g/dL (ref 13.0–17.0)
Lymphocytes Relative: 30.5 % (ref 12.0–46.0)
Lymphs Abs: 2.3 10*3/uL (ref 0.7–4.0)
MCHC: 33.8 g/dL (ref 30.0–36.0)
MCV: 85.2 fl (ref 78.0–100.0)
Monocytes Absolute: 0.5 10*3/uL (ref 0.1–1.0)
Monocytes Relative: 6.8 % (ref 3.0–12.0)
Neutro Abs: 4.3 10*3/uL (ref 1.4–7.7)
Neutrophils Relative %: 57.5 % (ref 43.0–77.0)
Platelets: 221 10*3/uL (ref 150.0–400.0)
RBC: 4.88 Mil/uL (ref 4.22–5.81)
RDW: 13.2 % (ref 11.5–15.5)
WBC: 7.5 10*3/uL (ref 4.0–10.5)

## 2021-09-12 LAB — LIPID PANEL
Cholesterol: 200 mg/dL (ref 0–200)
HDL: 39.9 mg/dL (ref >39.00)
NonHDL: 160.04
Total CHOL/HDL Ratio: 5
Triglycerides: 246 mg/dL — ABNORMAL HIGH (ref 0.0–149.0)
VLDL: 49.2 mg/dL — ABNORMAL HIGH (ref 0.0–40.0)

## 2021-09-12 LAB — HEMOGLOBIN A1C: Hgb A1c MFr Bld: 5.6 % (ref 4.6–6.5)

## 2021-09-12 LAB — LDL CHOLESTEROL, DIRECT: Direct LDL: 139 mg/dL

## 2021-09-12 NOTE — Patient Instructions (Signed)

## 2021-09-12 NOTE — Progress Notes (Signed)
Stephen Gay 35 y.o.   Chief Complaint  Patient presents with   Annual Exam    HISTORY OF PRESENT ILLNESS: This is a 35 y.o. male here for annual exam. Healthy male with a healthy lifestyle. Works in [employer] and is exposed to fumes and chemicals.  Wears a mask at work.  Concerned about his lungs. No other complaints or medical concerns today.  HPI   Prior to Admission medications   Medication Sig Start Date End Date Taking? Authorizing Provider  cephALEXin (KEFLEX) 500 MG capsule Take 1 capsule (500 mg total) by mouth 2 (two) times daily. Patient not taking: Reported on 09/12/2021 06/16/20   Drema Halon, MD    No Known Allergies  There are no problems to display for this patient.   Past Medical History:  Diagnosis Date   Deviated nasal septum    HLD (hyperlipidemia)    diet controlled, no meds    Past Surgical History:  Procedure Laterality Date   NASAL SEPTOPLASTY W/ TURBINOPLASTY Bilateral 06/16/2020   Procedure: NASAL SEPTOPLASTY WITH TURBINATE REDUCTION;  Surgeon: Drema Halon, MD;  Location: [facility] OR;  Service: ENT;  Laterality: Bilateral;   UVULECTOMY N/A 06/16/2020   Procedure: Alfredo Martinez;  Surgeon: Drema Halon, MD;  Location: [facility] OR;  Service: ENT;  Laterality: N/A;    Social History   Socioeconomic History   Marital status: Married    Spouse name: Not on file   Number of children: Not on file   Years of education: Not on file   Highest education level: Not on file  Occupational History   Not on file  Tobacco Use   Smoking status: Never   Smokeless tobacco: Never   Tobacco comments:    Pt smoked on and off.  Vaping Use   Vaping Use: Never used  Substance and Sexual Activity   Alcohol use: Not Currently    Comment: occasional beer   Drug use: Never   Sexual activity: Yes  Other Topics Concern   Not on file  Social History Narrative   Not on file   Social Determinants of Health   Financial Resource Strain: Not  on file  Food Insecurity: Not on file  Transportation Needs: Not on file  Physical Activity: Not on file  Stress: Not on file  Social Connections: Not on file  Intimate Partner Violence: Not on file    Family History  Problem Relation Age of Onset   Healthy Mother    Healthy Father      Review of Systems  Constitutional: Negative.  Negative for chills and fever.  HENT: Negative.  Negative for congestion and sore throat.   Respiratory: Negative.  Negative for cough, hemoptysis, sputum production, shortness of breath and wheezing.   Cardiovascular: Negative.  Negative for chest pain and palpitations.  Gastrointestinal: Negative.  Negative for abdominal pain, diarrhea, nausea and vomiting.  Genitourinary: Negative.  Negative for dysuria and hematuria.  Musculoskeletal:  Negative for back pain, joint pain and neck pain.  Skin: Negative.  Negative for rash.  Neurological:  Negative for dizziness and headaches.    Physical Exam Vitals reviewed.  Constitutional:      Appearance: Normal appearance.  HENT:     Head: Normocephalic.     Right Ear: Tympanic membrane, ear canal and external ear normal.     Left Ear: Tympanic membrane, ear canal and external ear normal.     Mouth/Throat:     Mouth: Mucous membranes are moist.  Pharynx: Oropharynx is clear.  Eyes:     Extraocular Movements: Extraocular movements intact.     Conjunctiva/sclera: Conjunctivae normal.     Pupils: Pupils are equal, round, and reactive to light.  Cardiovascular:     Rate and Rhythm: Normal rate and regular rhythm.     Pulses: Normal pulses.     Heart sounds: Normal heart sounds.  Pulmonary:     Effort: Pulmonary effort is normal.     Breath sounds: Normal breath sounds.  Abdominal:     General: There is no distension.     Palpations: Abdomen is soft. There is no mass.     Tenderness: There is no abdominal tenderness. There is no guarding.  Musculoskeletal:        General: Normal range of motion.      Cervical back: Normal range of motion and neck supple. No tenderness.  Lymphadenopathy:     Cervical: No cervical adenopathy.  Skin:    General: Skin is warm and dry.     Capillary Refill: Capillary refill takes less than 2 seconds.  Neurological:     General: No focal deficit present.     Mental Status: He is alert and oriented to person, place, and time.  Psychiatric:        Mood and Affect: Mood normal.        Behavior: Behavior normal.     ASSESSMENT & PLAN: Problem List Items Addressed This Visit   None Visit Diagnoses     Routine general medical examination at a health care facility    -  Primary   Need for hepatitis C screening test       Relevant Orders   Hepatitis C antibody screen   Screening for deficiency anemia       Relevant Orders   CBC with Differential   Screening for lipoid disorders       Relevant Orders   Lipid panel   Screening for endocrine, metabolic and immunity disorder       Relevant Orders   Comprehensive metabolic panel   Hemoglobin A1c   Inhalation of noxious fumes, accidental or unintentional, initial encounter       Relevant Orders   DG Chest 2 View      Modifiable risk factors discussed with patient. Anticipatory guidance according to age provided. The following topics were also discussed: Social Determinants of Health Smoking.  Non-smoker Diet and nutrition and need to decrease amount of daily carbohydrate intake Benefits of exercise Cancer family history review Vaccinations recommendations Cardiovascular risk assessment Mental health including depression and anxiety Fall and accident prevention Noxious fumes inhalation at work.  Need for chest x-ray.  Patient Instructions  Health Maintenance, Male Adopting a healthy lifestyle and getting preventive care are important in promoting health and wellness. Ask your health care provider about: The right schedule for you to have regular tests and exams. Things you can do on your  own to prevent diseases and keep yourself healthy. What should I know about diet, weight, and exercise? Eat a healthy diet  Eat a diet that includes plenty of vegetables, fruits, low-fat dairy products, and lean protein. Do not eat a lot of foods that are high in solid fats, added sugars, or sodium. Maintain a healthy weight Body mass index (BMI) is a measurement that can be used to identify possible weight problems. It estimates body fat based on height and weight. Your health care provider can help determine your BMI and help you achieve  or maintain a healthy weight. Get regular exercise Get regular exercise. This is one of the most important things you can do for your health. Most adults should: Exercise for at least 150 minutes each week. The exercise should increase your heart rate and make you sweat (moderate-intensity exercise). Do strengthening exercises at least twice a week. This is in addition to the moderate-intensity exercise. Spend less time sitting. Even light physical activity can be beneficial. Watch cholesterol and blood lipids Have your blood tested for lipids and cholesterol at 34 years of age, then have this test every 5 years. You may need to have your cholesterol levels checked more often if: Your lipid or cholesterol levels are high. You are older than 34 years of age. You are at high risk for heart disease. What should I know about cancer screening? Many types of cancers can be detected early and may often be prevented. Depending on your health history and family history, you may need to have cancer screening at various ages. This may include screening for: Colorectal cancer. Prostate cancer. Skin cancer. Lung cancer. What should I know about heart disease, diabetes, and high blood pressure? Blood pressure and heart disease High blood pressure causes heart disease and increases the risk of stroke. This is more likely to develop in people who have high blood  pressure readings or are overweight. Talk with your health care provider about your target blood pressure readings. Have your blood pressure checked: Every 3-5 years if you are 46-74 years of age. Every year if you are 2 years old or older. If you are between the ages of 25 and 2 and are a current or former smoker, ask your health care provider if you should have a one-time screening for abdominal aortic aneurysm (AAA). Diabetes Have regular diabetes screenings. This checks your fasting blood sugar level. Have the screening done: Once every three years after age 81 if you are at a normal weight and have a low risk for diabetes. More often and at a younger age if you are overweight or have a high risk for diabetes. What should I know about preventing infection? Hepatitis B If you have a higher risk for hepatitis B, you should be screened for this virus. Talk with your health care provider to find out if you are at risk for hepatitis B infection. Hepatitis C Blood testing is recommended for: Everyone born from [birth year] through 1964. Anyone with known risk factors for hepatitis C. Sexually transmitted infections (STIs) You should be screened each year for STIs, including gonorrhea and chlamydia, if: You are sexually active and are younger than 34 years of age. You are older than 34 years of age and your health care provider tells you that you are at risk for this type of infection. Your sexual activity has changed since you were last screened, and you are at increased risk for chlamydia or gonorrhea. Ask your health care provider if you are at risk. Ask your health care provider about whether you are at high risk for HIV. Your health care provider may recommend a prescription medicine to help prevent HIV infection. If you choose to take medicine to prevent HIV, you should first get tested for HIV. You should then be tested every 3 months for as long as you are taking the medicine. Follow these  instructions at home: Alcohol use Do not drink alcohol if your health care provider tells you not to drink. If you drink alcohol: Limit how much you have  to 0-2 drinks a day. Know how much alcohol is in your drink. In the [place] one drink equals one 12 oz bottle of beer (355 mL), one 5 oz glass of wine (148 mL), or one 1 oz glass of hard liquor (44 mL). Lifestyle Do not use any products that contain nicotine or tobacco. These products include cigarettes, chewing tobacco, and vaping devices, such as e-cigarettes. If you need help quitting, ask your health care provider. Do not use street drugs. Do not share needles. Ask your health care provider for help if you need support or information about quitting drugs. General instructions Schedule regular health, dental, and eye exams. Stay current with your vaccines. Tell your health care provider if: You often feel depressed. You have ever been abused or do not feel safe at home. Summary Adopting a healthy lifestyle and getting preventive care are important in promoting health and wellness. Follow your health care provider's instructions about healthy diet, exercising, and getting tested or screened for diseases. Follow your health care provider's instructions on monitoring your cholesterol and blood pressure. This information is not intended to replace advice given to you by your health care provider. Make sure you discuss any questions you have with your health care provider. Document Revised: 02/27/2021 Document Reviewed: 02/27/2021 Elsevier Patient Education  2022 Elsevier Inc.       Edwina Barth, MD [facility]

## 2021-09-13 LAB — HEPATITIS C ANTIBODY
Hepatitis C Ab: NONREACTIVE
SIGNAL TO CUT-OFF: 0.07 (ref <1.00)

## 2021-10-02 IMAGING — DX DG CHEST 2V
2 series · 2 of 2 positions shown · non-contrast
Comparison: August 05, 2018

CLINICAL DATA: Chest pain

EXAM:
CHEST - 2 VIEW

[chest pa]
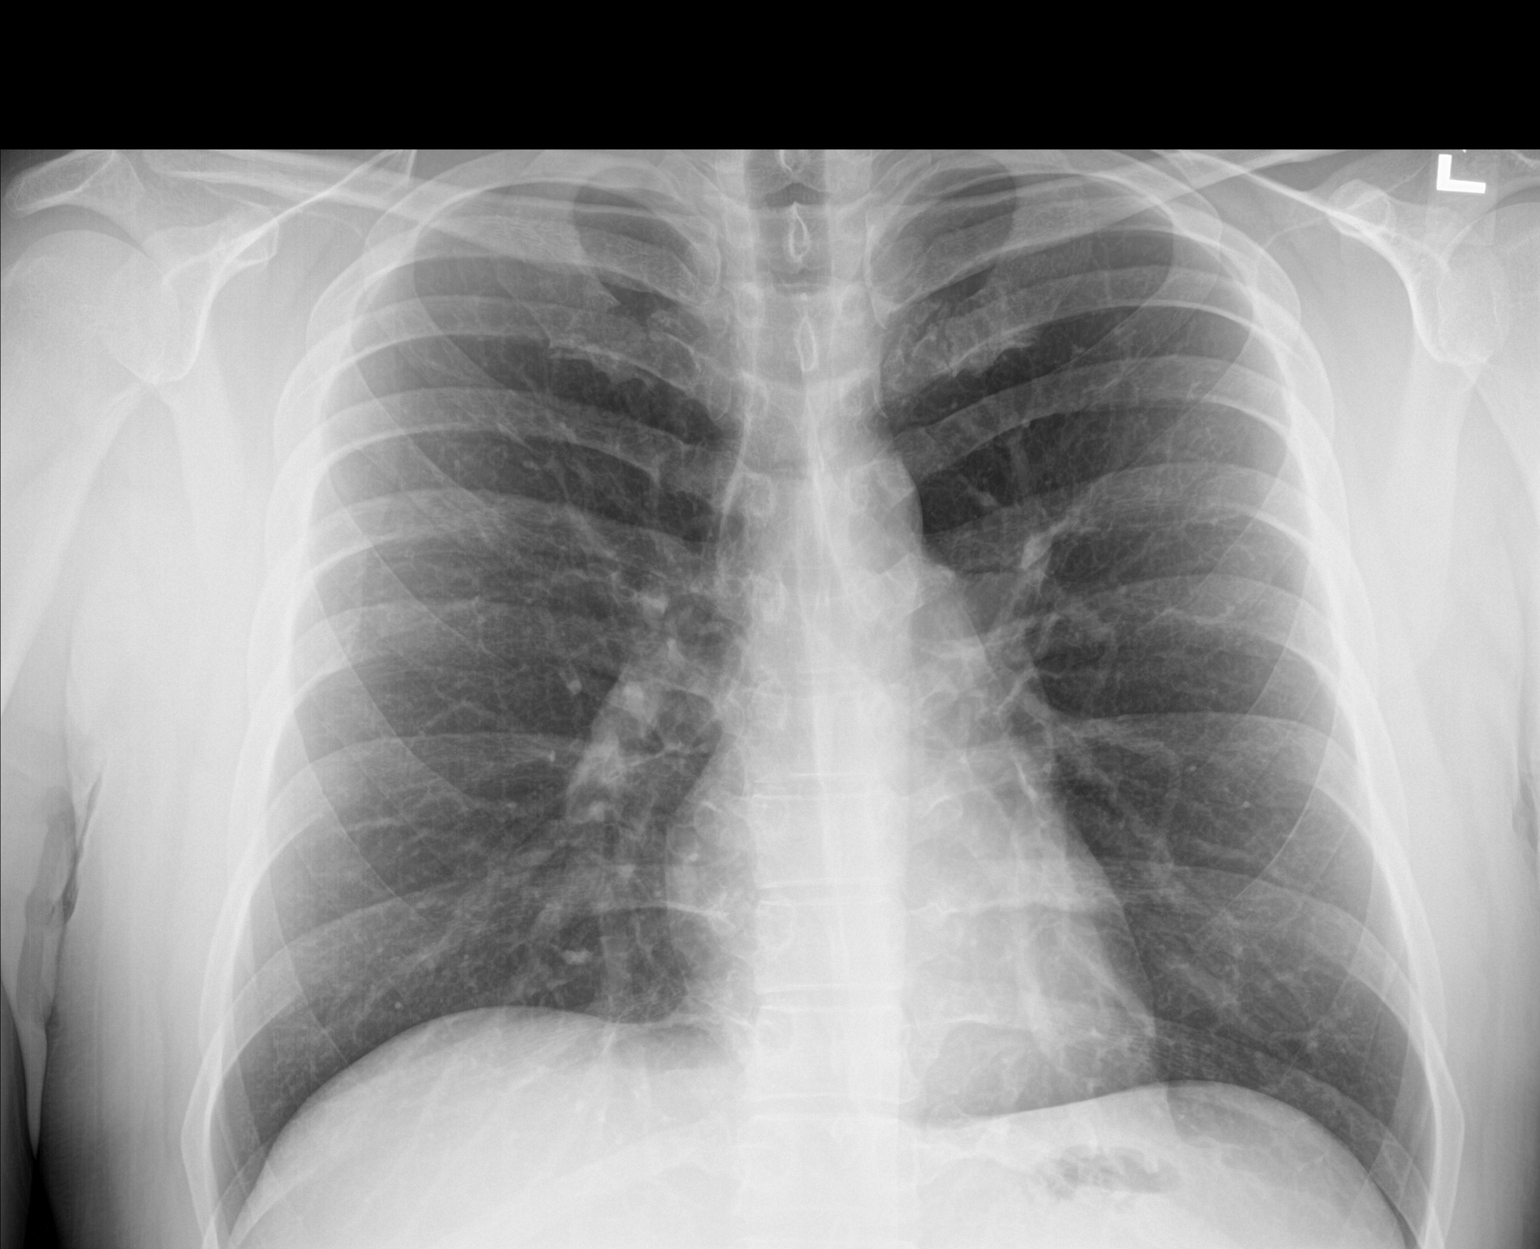

[chest lat]
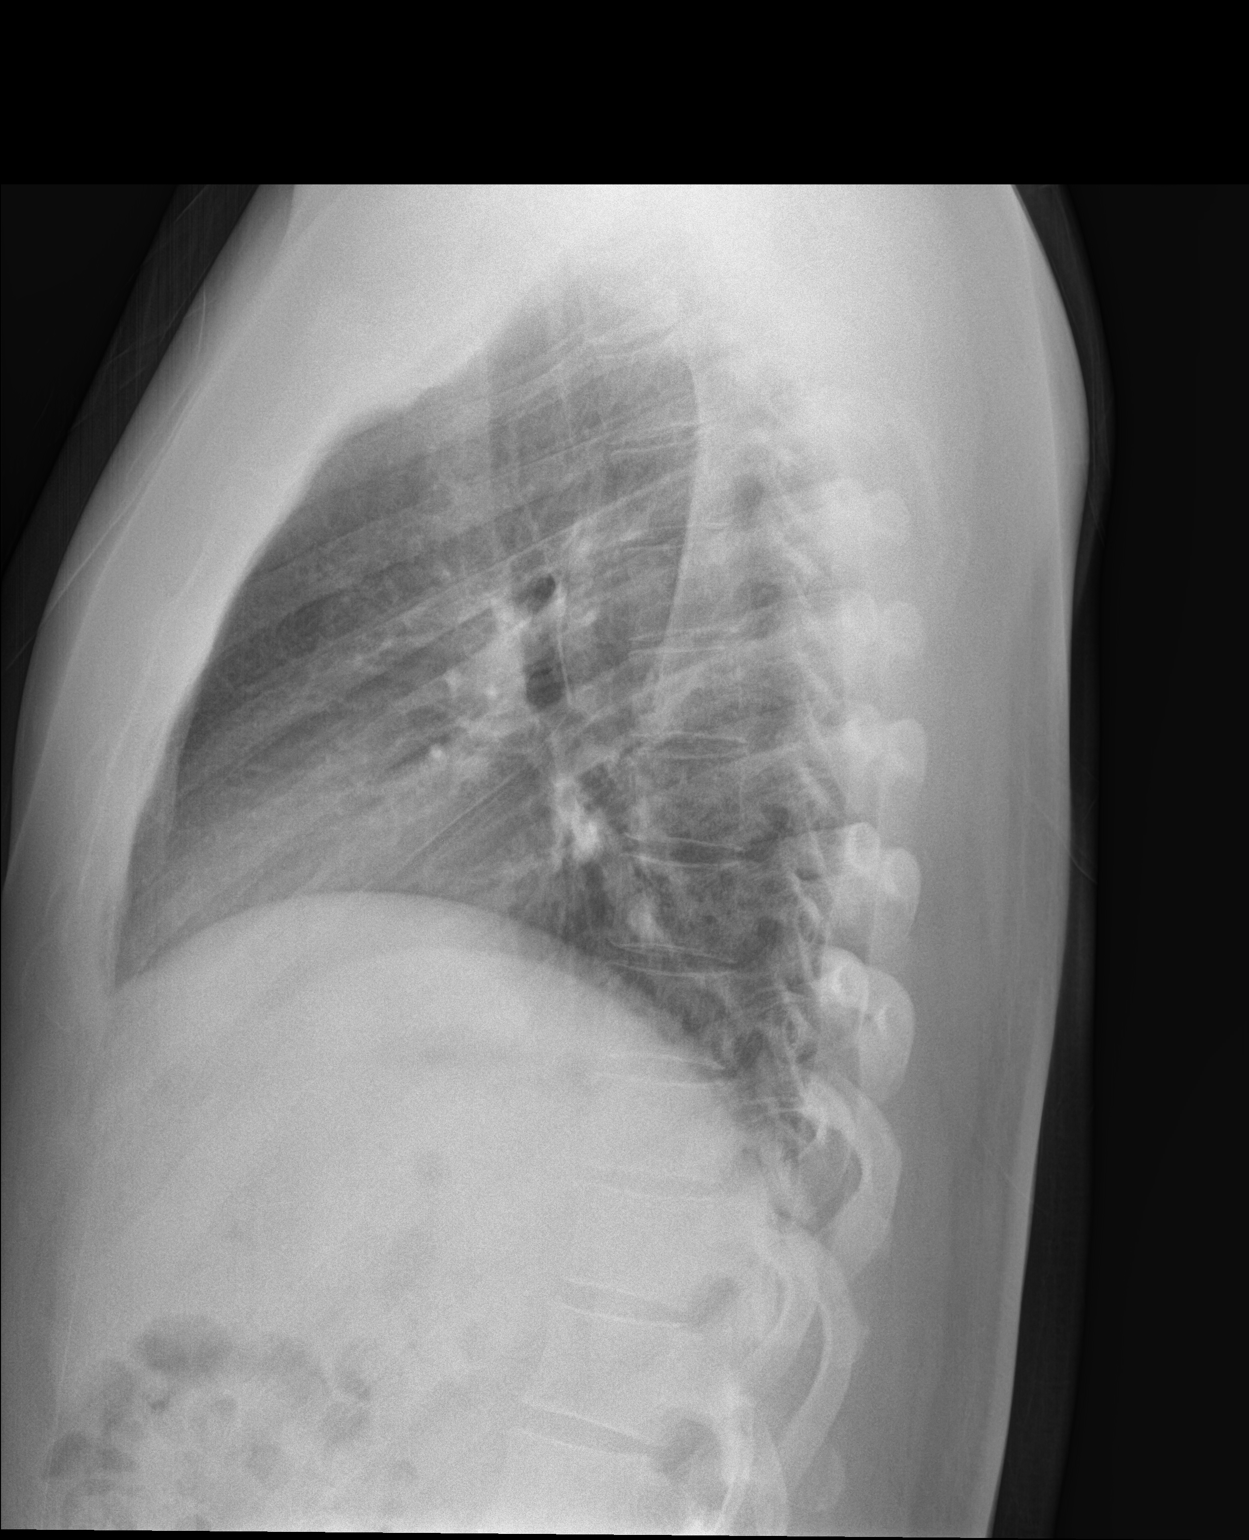

[2 of 2 positions shown; findings below may reference images not displayed]

FINDINGS: Lungs are clear. Heart size and pulmonary vascularity are normal. No
adenopathy. No pneumothorax. No bone lesions.
IMPRESSION: No abnormality noted.
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
## 2023-09-18 ENCOUNTER — Ambulatory Visit (INDEPENDENT_AMBULATORY_CARE_PROVIDER_SITE_OTHER): Payer: 59

## 2023-09-18 ENCOUNTER — Ambulatory Visit: Payer: 59 | Admitting: Emergency Medicine

## 2023-09-18 ENCOUNTER — Encounter: Payer: Self-pay | Admitting: Emergency Medicine

## 2023-09-18 VITALS — BP 120/76 | HR 81 | Temp 98.2°F | Ht 71.0 in | Wt 232.0 lb

## 2023-09-18 DIAGNOSIS — R109 Unspecified abdominal pain: Secondary | ICD-10-CM | POA: Diagnosis not present

## 2023-09-18 MED ORDER — CYCLOBENZAPRINE HCL 10 MG PO TABS: 10.0000 mg | ORAL_TABLET | Freq: Every day | ORAL | 0 refills | Status: AC

## 2023-09-18 MED ORDER — TRAMADOL HCL 50 MG PO TABS: 50.0000 mg | ORAL_TABLET | Freq: Three times a day (TID) | ORAL | 0 refills | Status: AC | PRN

## 2023-09-18 NOTE — Assessment & Plan Note (Signed)
Clinically stable.  No red flag signs or symptoms. Differential diagnosis discussed Benign physical examination.  Afebrile. Recommend blood work and urinalysis today Most likely musculoskeletal in origin and related to activities of daily work.  Mechanical in nature. Recommend lumbar spine x-rays. Pain management discussed Recommend Flexeril 10 mg at bedtime Tylenol for mild to moderate pain and tramadol for moderate to severe pain Follow-up as needed.

## 2023-09-18 NOTE — Patient Instructions (Signed)
Dolor en la dolor en la fosa lumbar en adultos Flank Pain, Adult El dolor en la fosa lumbar es aquel dolor que se siente en un lado del cuerpo. El flanco es la zona que se localiza en un lado del cuerpo, entre la parte superior del vientre (abdomen) y Landscape architect. El Software engineer en un perodo corto de [place] o puede durar mucho tiempo o reaparecer con frecuencia (crnico). Puede ser leve o muy intenso. El dolor en esta zona puede tener diferentes causas. Siga estas instrucciones en su casa:  Beba suficiente lquido para mantener el pis (la orina) de color amarillo plido. Haga reposo como se lo haya indicado el mdico. Use los medicamentos de venta libre y los recetados solamente como se lo haya indicado el mdico. Realice un seguimiento por escrito de lo siguiente: Qu le caus dolor en la fosa lumbar. Qu ha hecho que Chief Technology Officer en el flanco mejore. Concurra a todas las visitas de seguimiento. Comunquese con un mdico si: Los medicamentos no Tourist information centre manager. Aparecen nuevos sntomas. El dolor [place]. Los sntomas duran ms de 2 a [address]. Tiene dificultad para orinar. Orina con ms frecuencia que lo normal. Solicite ayuda de inmediato si: Tiene dificultad para respirar. Le falta el aire. Le duele el vientre, o este est hinchado o enrojecido. Tiene ganas de vomitar (nuseas). Vomita. Siente que podra desmayarse o se desmaya. Observa sangre en la orina. Tiene dolor en la fosa lumbar y fiebre. Estos sntomas pueden Customer service manager. Solicite ayuda de inmediato. Comunquese con el servicio de emergencias de su localidad (911 en los [place]). No espere a ver si los sntomas desaparecen. No conduzca por sus propios medios [employer]. Resumen El dolor en la fosa lumbar es aquel dolor que se siente en un lado del cuerpo. El flanco es la zona que se localiza en un lado del cuerpo, entre la parte superior del vientre (abdomen) y Landscape architect. El dolor  en la fosa lumbar puede aparecer en un perodo corto de tiempo (agudo) o puede durar mucho tiempo o reaparecer con frecuencia (crnico). Puede ser leve o muy intenso. El dolor en esta zona puede tener diferentes causas. Comunquese con su mdico si los sntomas empeoran o si duran ms de 2 a 3 das. Esta informacin no tiene Theme park manager el consejo del mdico. Asegrese de hacerle al mdico cualquier pregunta que tenga. Document Revised: 01/10/2021 Document Reviewed: 01/10/2021 Elsevier Patient Education  2024 [employer].

## 2023-09-18 NOTE — Progress Notes (Signed)
Stephen Gay 37 y.o.   Chief Complaint  Patient presents with   Flank Pain    Right back side pain that started 4 weeks ago. Pt states it hurts when he moves.     HISTORY OF PRESENT ILLNESS: This is a 37 y.o. male complaining of right flank pain that started about 4 weeks ago Physical work.  Localized pain worse with movement.  No radiation.  No associated symptoms. No other complaints or medical concerns today.  Flank Pain Pertinent negatives include no abdominal pain, chest pain, fever or headaches.     Prior to Admission medications   Medication Sig Start Date End Date Taking? Authorizing Provider  cephALEXin (KEFLEX) 500 MG capsule Take 1 capsule (500 mg total) by mouth 2 (two) times daily. Patient not taking: Reported on 09/12/2021 06/16/20   Drema Halon, MD    No Known Allergies  There are no problems to display for this patient.   Past Medical History:  Diagnosis Date   Deviated nasal septum    HLD (hyperlipidemia)    diet controlled, no meds    Past Surgical History:  Procedure Laterality Date   NASAL SEPTOPLASTY W/ TURBINOPLASTY Bilateral 06/16/2020   Procedure: NASAL SEPTOPLASTY WITH TURBINATE REDUCTION;  Surgeon: Drema Halon, MD;  Location: [facility] OR;  Service: ENT;  Laterality: Bilateral;   UVULECTOMY N/A 06/16/2020   Procedure: Alfredo Martinez;  Surgeon: Drema Halon, MD;  Location: [facility] OR;  Service: ENT;  Laterality: N/A;    Social History   Socioeconomic History   Marital status: Married    Spouse name: Not on file   Number of children: Not on file   Years of education: Not on file   Highest education level: Not on file  Occupational History   Not on file  Tobacco Use   Smoking status: Never   Smokeless tobacco: Never   Tobacco comments:    Pt smoked on and off.  Vaping Use   Vaping status: Never Used  Substance and Sexual Activity   Alcohol use: Not Currently    Comment: occasional beer   Drug use: Never   Sexual  activity: Yes  Other Topics Concern   Not on file  Social History Narrative   Not on file   Social Determinants of Health   Financial Resource Strain: Not on file  Food Insecurity: Not on file  Transportation Needs: Not on file  Physical Activity: Not on file  Stress: Not on file  Social Connections: Not on file  Intimate Partner Violence: Not on file    Family History  Problem Relation Age of Onset   Healthy Mother    Healthy Father      Review of Systems  Constitutional: Negative.  Negative for chills and fever.  HENT: Negative.  Negative for congestion and sore throat.   Respiratory: Negative.  Negative for cough and shortness of breath.   Cardiovascular: Negative.  Negative for chest pain and palpitations.  Gastrointestinal:  Negative for abdominal pain, nausea and vomiting.  Genitourinary:  Positive for flank pain.  Skin: Negative.  Negative for rash.  Neurological: Negative.  Negative for dizziness and headaches.  All other systems reviewed and are negative.   Today's Vitals   09/18/23 1607  BP: 120/76  Pulse: 81  Temp: 98.2 F (36.8 C)  TempSrc: Oral  SpO2: 97%  Weight: 232 lb (105.2 kg)  Height: 5\' 11"  (1.803 m)   Body mass index is 32.36 kg/m.   Physical Exam Vitals  reviewed.  Constitutional:      Appearance: Normal appearance.  HENT:     Head: Normocephalic.  Eyes:     Extraocular Movements: Extraocular movements intact.  Cardiovascular:     Rate and Rhythm: Normal rate and regular rhythm.     Pulses: Normal pulses.     Heart sounds: Normal heart sounds.  Pulmonary:     Effort: Pulmonary effort is normal.     Breath sounds: Normal breath sounds.  Abdominal:     Palpations: Abdomen is soft.     Tenderness: There is no abdominal tenderness.  Musculoskeletal:     Lumbar back: Tenderness present. No bony tenderness.  Skin:    General: Skin is warm and dry.     Capillary Refill: Capillary refill takes less than 2 seconds.  Neurological:      Mental Status: He is alert and oriented to person, place, and time.  Psychiatric:        Mood and Affect: Mood normal.        Behavior: Behavior normal.      ASSESSMENT & PLAN: A total of 42 minutes was spent with the patient and counseling/coordination of care regarding preparing for this visit, review of most recent office visit notes, differential diagnosis of right flank pain and need for workup, review of all medications, pain management, review of most recent bloodwork results, review of health maintenance items, prognosis, documentation, and need for follow up.   Problem List Items Addressed This Visit       Other   Right flank pain - Primary    Clinically stable.  No red flag signs or symptoms. Differential diagnosis discussed Benign physical examination.  Afebrile. Recommend blood work and urinalysis today Most likely musculoskeletal in origin and related to activities of daily work.  Mechanical in nature. Recommend lumbar spine x-rays. Pain management discussed Recommend Flexeril 10 mg at bedtime Tylenol for mild to moderate pain and tramadol for moderate to severe pain Follow-up as needed.      Relevant Medications   cyclobenzaprine (FLEXERIL) 10 MG tablet   traMADol (ULTRAM) 50 MG tablet   Other Relevant Orders   Urinalysis   CBC with Differential/Platelet   Comprehensive metabolic panel   Hemoglobin A1c   DG Lumbar Spine 2-3 Views   Patient Instructions  Dolor en la dolor en la fosa lumbar en adultos Flank Pain, Adult El dolor en la fosa lumbar es aquel dolor que se siente en un lado del cuerpo. El flanco es la zona que se localiza en un lado del cuerpo, entre la parte superior del vientre (abdomen) y Landscape architect. El Software engineer en un perodo corto de [place] o puede durar mucho tiempo o reaparecer con frecuencia (crnico). Puede ser leve o muy intenso. El dolor en esta zona puede tener diferentes causas. Siga estas instrucciones en su  casa:  Beba suficiente lquido para mantener el pis (la orina) de color amarillo plido. Haga reposo como se lo haya indicado el mdico. Use los medicamentos de venta libre y los recetados solamente como se lo haya indicado el mdico. Realice un seguimiento por escrito de lo siguiente: Qu le caus dolor en la fosa lumbar. Qu ha hecho que Chief Technology Officer en el flanco mejore. Concurra a todas las visitas de seguimiento. Comunquese con un mdico si: Los medicamentos no Tourist information centre manager. Aparecen nuevos sntomas. El dolor [place]. Los sntomas duran ms de 2 a [address]. Tiene dificultad para orinar. Orina con ms frecuencia  que lo normal. Solicite ayuda de inmediato si: Tiene dificultad para respirar. Le falta el aire. Le duele el vientre, o este est hinchado o enrojecido. Tiene ganas de vomitar (nuseas). Vomita. Siente que podra desmayarse o se desmaya. Observa sangre en la orina. Tiene dolor en la fosa lumbar y fiebre. Estos sntomas pueden Customer service manager. Solicite ayuda de inmediato. Comunquese con el servicio de emergencias de su localidad (911 en los [place]). No espere a ver si los sntomas desaparecen. No conduzca por sus propios medios [employer]. Resumen El dolor en la fosa lumbar es aquel dolor que se siente en un lado del cuerpo. El flanco es la zona que se localiza en un lado del cuerpo, entre la parte superior del vientre (abdomen) y Landscape architect. El dolor en la fosa lumbar puede aparecer en un perodo corto de tiempo (agudo) o puede durar mucho tiempo o reaparecer con frecuencia (crnico). Puede ser leve o muy intenso. El dolor en esta zona puede tener diferentes causas. Comunquese con su mdico si los sntomas empeoran o si duran ms de 2 a 3 das. Esta informacin no tiene Theme park manager el consejo del mdico. Asegrese de hacerle al mdico cualquier pregunta que tenga. Document Revised: 01/10/2021 Document Reviewed: 01/10/2021 Elsevier  Patient Education  2024 Elsevier Inc.     Edwina Barth, MD [facility]

## 2023-09-19 LAB — COMPREHENSIVE METABOLIC PANEL
AG Ratio: 1.7 (calc) (ref 1.0–2.5)
ALT: 15 U/L (ref 9–46)
AST: 15 U/L (ref 10–40)
Albumin: 4.2 g/dL (ref 3.6–5.1)
Alkaline phosphatase (APISO): 75 U/L (ref 36–130)
BUN: 16 mg/dL (ref 7–25)
CO2: 26 mmol/L (ref 20–32)
Calcium: 9.2 mg/dL (ref 8.6–10.3)
Chloride: 103 mmol/L (ref 98–110)
Creat: 0.89 mg/dL (ref 0.60–1.26)
Globulin: 2.5 g/dL (ref 1.9–3.7)
Glucose, Bld: 94 mg/dL (ref 65–99)
Potassium: 4.2 mmol/L (ref 3.5–5.3)
Sodium: 139 mmol/L (ref 135–146)
Total Bilirubin: 0.3 mg/dL (ref 0.2–1.2)
Total Protein: 6.7 g/dL (ref 6.1–8.1)

## 2023-09-19 LAB — HEMOGLOBIN A1C
Hgb A1c MFr Bld: 5.7 %{Hb} — ABNORMAL HIGH (ref <5.7)
Mean Plasma Glucose: 117 mg/dL
eAG (mmol/L): 6.5 mmol/L

## 2023-09-19 LAB — CBC WITH DIFFERENTIAL/PLATELET
Absolute Lymphocytes: 2570 {cells}/uL (ref 850–3900)
Absolute Monocytes: 511 {cells}/uL (ref 200–950)
Basophils Absolute: 37 {cells}/uL (ref 0–200)
Basophils Relative: 0.5 %
Eosinophils Absolute: 343 {cells}/uL (ref 15–500)
Eosinophils Relative: 4.7 %
HCT: 42.9 % (ref 38.5–50.0)
Hemoglobin: 14.1 g/dL (ref 13.2–17.1)
MCH: 28.4 pg (ref 27.0–33.0)
MCHC: 32.9 g/dL (ref 32.0–36.0)
MCV: 86.5 fL (ref 80.0–100.0)
MPV: 11.4 fL (ref 7.5–12.5)
Monocytes Relative: 7 %
Neutro Abs: 3840 {cells}/uL (ref 1500–7800)
Neutrophils Relative %: 52.6 %
Platelets: 233 10*3/uL (ref 140–400)
RBC: 4.96 10*6/uL (ref 4.20–5.80)
RDW: 13 % (ref 11.0–15.0)
Total Lymphocyte: 35.2 %
WBC: 7.3 10*3/uL (ref 3.8–10.8)

## 2023-09-19 LAB — URINALYSIS
Bilirubin Urine: NEGATIVE
Glucose, UA: NEGATIVE
Hgb urine dipstick: NEGATIVE
Ketones, ur: NEGATIVE
Leukocytes,Ua: NEGATIVE
Nitrite: NEGATIVE
Protein, ur: NEGATIVE
Specific Gravity, Urine: 1.018 (ref 1.001–1.035)
pH: 6.5 (ref 5.0–8.0)

## 2024-04-27 ENCOUNTER — Encounter (HOSPITAL_COMMUNITY): Payer: Self-pay

## 2024-04-27 ENCOUNTER — Ambulatory Visit (HOSPITAL_COMMUNITY)
Admission: EM | Admit: 2024-04-27 | Discharge: 2024-04-27 | Disposition: A | Discharge status: Home / Self Care | Attending: Internal Medicine | Admitting: Internal Medicine

## 2024-04-27 DIAGNOSIS — J069 Acute upper respiratory infection, unspecified: Secondary | ICD-10-CM

## 2024-04-27 LAB — POC SARS CORONAVIRUS 2 AG -  ED: SARS Coronavirus 2 Ag: NEGATIVE

## 2024-04-27 MED ORDER — GUAIFENESIN ER 1200 MG PO TB12: 1200.0000 mg | ORAL_TABLET | Freq: Two times a day (BID) | ORAL | 0 refills | Status: AC

## 2024-04-27 MED ORDER — PROMETHAZINE-DM 6.25-15 MG/5ML PO SYRP: 5.0000 mL | ORAL_SOLUTION | Freq: Every evening | ORAL | 0 refills | Status: AC | PRN

## 2024-04-27 NOTE — ED Provider Notes (Signed)
 [facility]    CSN: [id number] Arrival date & time: 04/27/24  0802      History   Chief Complaint Chief Complaint  Patient presents with   Sore Throat   Cough   Nasal Congestion    HPI Stephen Gay is a 38 y.o. male.   Stephen Gay is a 38 y.o. male presenting for chief complaint of Sore Throat, Cough, and Nasal Congestion that started 3 days ago.  His 47-month-old son is sick with similar symptoms.  Cough is productive with yellow sputum.  Reports generalized fatigue as well.  Denies shortness of breath, chest pain, heart palpitations, nausea, vomiting, diarrhea, abdominal pain, rash, and fever/chills.  He is a former smoker, denies drug use.  Denies history of asthma or COPD.  He is taking Sudafed and Tylenol  with some relief of symptoms.   Sore Throat  Cough   Past Medical History:  Diagnosis Date   Deviated nasal septum    HLD (hyperlipidemia)    diet controlled, no meds    Patient Active Problem List   Diagnosis Date Noted   Right flank pain 09/18/2023    Past Surgical History:  Procedure Laterality Date   NASAL SEPTOPLASTY W/ TURBINOPLASTY Bilateral 06/16/2020   Procedure: NASAL SEPTOPLASTY WITH TURBINATE REDUCTION;  Surgeon: Ethyl Lonni BRAVO, MD;  Location: [facility] OR;  Service: ENT;  Laterality: Bilateral;   UVULECTOMY N/A 06/16/2020   Procedure: OPHELIA;  Surgeon: Ethyl Lonni BRAVO, MD;  Location: [facility] OR;  Service: ENT;  Laterality: N/A;       Home Medications    Prior to Admission medications   Medication Sig Start Date End Date Taking? Authorizing Provider  Guaifenesin  1200 MG TB12 Take 1 tablet (1,200 mg total) by mouth in the morning and at bedtime. 04/27/24  Yes Enedelia Dorna HERO, FNP  promethazine -dextromethorphan (PROMETHAZINE -DM) 6.25-15 MG/5ML syrup Take 5 mLs by mouth at bedtime as needed for cough. 04/27/24  Yes Enedelia Dorna HERO, FNP  cephALEXin  (KEFLEX ) 500 MG capsule Take 1 capsule (500 mg total) by mouth 2 (two) times  daily. Patient not taking: Reported on 09/12/2021 06/16/20   Ethyl Lonni BRAVO, MD  cyclobenzaprine  (FLEXERIL ) 10 MG tablet Take 1 tablet (10 mg total) by mouth at bedtime. 09/18/23   Purcell Emil Schanz, MD    Family History Family History  Problem Relation Age of Onset   Healthy Mother    Healthy Father     Social History Social History   Tobacco Use   Smoking status: Never   Smokeless tobacco: Never   Tobacco comments:    Pt smoked on and off.  Vaping Use   Vaping status: Never Used  Substance Use Topics   Alcohol use: Not Currently    Comment: occasional beer   Drug use: Never     Allergies   Patient has no known allergies.   Review of Systems Review of Systems  Respiratory:  Positive for cough.   Per HPI   Physical Exam Triage Vital Signs ED Triage Vitals  Encounter Vitals Group     BP 04/27/24 0810 112/76     Girls Systolic BP Percentile --      Girls Diastolic BP Percentile --      Boys Systolic BP Percentile --      Boys Diastolic BP Percentile --      Pulse Rate 04/27/24 0810 91     Resp 04/27/24 0810 20     Temp 04/27/24 0810 97.6 F (36.4 C)  Temp Source 04/27/24 0810 Oral     SpO2 04/27/24 0810 98 %     Weight --      Height --      Head Circumference --      Peak Flow --      Pain Score 04/27/24 0811 4     Pain Loc --      Pain Education --      Exclude from Growth Chart --    No data found.  Updated Vital Signs BP 112/76 (BP Location: Left Arm)   Pulse 91   Temp 97.6 F (36.4 C) (Oral)   Resp 20   SpO2 98%   Visual Acuity Right Eye Distance:   Left Eye Distance:   Bilateral Distance:    Right Eye Near:   Left Eye Near:    Bilateral Near:     Physical Exam Vitals and nursing note reviewed.  Constitutional:      Appearance: He is not ill-appearing or toxic-appearing.  HENT:     Head: Normocephalic and atraumatic.     Right Ear: Hearing, tympanic membrane, ear canal and external ear normal.     Left Ear:  Hearing, tympanic membrane, ear canal and external ear normal.     Nose: Congestion present.     Mouth/Throat:     Lips: Pink.     Mouth: Mucous membranes are moist. No injury or oral lesions.     Dentition: Normal dentition.     Tongue: No lesions.     Pharynx: Oropharynx is clear. Uvula midline. Posterior oropharyngeal erythema present. No pharyngeal swelling, oropharyngeal exudate, uvula swelling or postnasal drip.     Tonsils: No tonsillar exudate.     Comments: Mild erythema to posterior oropharynx with small amount of clear postnasal drainage visualized. Eyes:     General: Lids are normal. Vision grossly intact. Gaze aligned appropriately.     Extraocular Movements: Extraocular movements intact.     Conjunctiva/sclera: Conjunctivae normal.  Neck:     Trachea: Trachea and phonation normal.  Cardiovascular:     Rate and Rhythm: Normal rate and regular rhythm.     Heart sounds: Normal heart sounds, S1 normal and S2 normal.  Pulmonary:     Effort: Pulmonary effort is normal. No respiratory distress.     Breath sounds: Normal breath sounds and air entry. No wheezing, rhonchi or rales.  Chest:     Chest wall: No tenderness.  Musculoskeletal:     Cervical back: Neck supple.  Lymphadenopathy:     Cervical: No cervical adenopathy.  Skin:    General: Skin is warm and dry.     Capillary Refill: Capillary refill takes less than 2 seconds.     Findings: No rash.  Neurological:     General: No focal deficit present.     Mental Status: He is alert and oriented to person, place, and time. Mental status is at baseline.     Cranial Nerves: No dysarthria or facial asymmetry.  Psychiatric:        Mood and Affect: Mood normal.        Speech: Speech normal.        Behavior: Behavior normal.        Thought Content: Thought content normal.        Judgment: Judgment normal.      UC Treatments / Results  Labs (all labs ordered are listed, but only abnormal results are displayed) Labs  Reviewed  POC SARS CORONAVIRUS 2 AG -  ED    EKG   Radiology No results found.  Procedures Procedures (including critical care time)  Medications Ordered in UC Medications - No data to display  Initial Impression / Assessment and Plan / UC Course  I have reviewed the triage vital signs and the nursing notes.  Pertinent labs & imaging results that were available during my care of the patient were reviewed by me and considered in my medical decision making (see chart for details).   1.  Viral URI with cough Suspect viral URI, viral syndrome.  Strep/viral testing: Point-of-care COVID testing is negative.  Physical exam findings reassuring, vital signs hemodynamically stable, and lungs clear, therefore deferred imaging of the chest.  Advised supportive care/prescriptions for symptomatic relief as outlined in AVS.    Counseled patient on potential for adverse effects with medications prescribed/recommended today, strict ER and return-to-clinic precautions discussed, patient verbalized understanding.    Final Clinical Impressions(s) / UC Diagnoses   Final diagnoses:  Viral URI with cough     Discharge Instructions      You have a viral illness which will improve on its own with rest, fluids, and medications to help with your symptoms.  COVID testing is negative.   Tylenol , guaifenesin  (plain mucinex ), and saline nasal sprays may help relieve symptoms.   Promethazine  DM at bedtime as needed for cough.   Two teaspoons of honey in 1 cup of warm water every 4-6 hours may help with throat pains.  Humidifier in room at nighttime may help soothe cough (clean well daily).   For chest pain, shortness of breath, inability to keep food or fluids down without vomiting, fever that does not respond to tylenol  or motrin, or any other severe symptoms, please go to the ER for further evaluation. Return to urgent care as needed, otherwise follow-up with PCP.       ED Prescriptions      Medication Sig Dispense Auth. Provider   promethazine -dextromethorphan (PROMETHAZINE -DM) 6.25-15 MG/5ML syrup Take 5 mLs by mouth at bedtime as needed for cough. 118 mL Enedelia Going M, FNP   Guaifenesin  1200 MG TB12 Take 1 tablet (1,200 mg total) by mouth in the morning and at bedtime. 14 tablet Enedelia Going HERO, FNP      PDMP not reviewed this encounter.   Enedelia Going [place], [place] 04/27/24 [id number]

## 2024-04-27 NOTE — Discharge Instructions (Addendum)
 You have a viral illness which will improve on its own with rest, fluids, and medications to help with your symptoms.  COVID testing is negative.   Tylenol , guaifenesin  (plain mucinex ), and saline nasal sprays may help relieve symptoms.   Promethazine  DM at bedtime as needed for cough.   Two teaspoons of honey in 1 cup of warm water every 4-6 hours may help with throat pains.  Humidifier in room at nighttime may help soothe cough (clean well daily).   For chest pain, shortness of breath, inability to keep food or fluids down without vomiting, fever that does not respond to tylenol  or motrin, or any other severe symptoms, please go to the ER for further evaluation. Return to urgent care as needed, otherwise follow-up with PCP.

## 2024-04-27 NOTE — ED Triage Notes (Addendum)
 Sore throat,nasal congestion and headache x 3 days. Patient has been taking Sudafed. No N/VD.

## 2024-07-20 NOTE — Telephone Encounter (Signed)
 Pt left message w/answering service that he need to know where to go for his per op covid test. I did leave him a message with this information after I spoke with Alisa that arranges those appointments. Pt. Did not acknowledge that he got my voice mail about surgery being moved from [facility] to [facility].

## 2024-07-20 NOTE — Telephone Encounter (Signed)
 Surgery moved from [facility] to [facility].

## 2024-07-20 NOTE — Telephone Encounter (Signed)
 error
